# Patient Record
Sex: Male | Born: 1985 | Race: White | Hispanic: No | Marital: Married | Smoking: Never smoker
Health system: Southern US, Community
[De-identification: ages and names within clinical notes are randomized; demographics above are authoritative.]

## PROBLEM LIST (undated history)

## (undated) DIAGNOSIS — F411 Generalized anxiety disorder: Secondary | ICD-10-CM

## (undated) DIAGNOSIS — Z973 Presence of spectacles and contact lenses: Secondary | ICD-10-CM

## (undated) DIAGNOSIS — F32A Depression, unspecified: Secondary | ICD-10-CM

## (undated) DIAGNOSIS — F909 Attention-deficit hyperactivity disorder, unspecified type: Secondary | ICD-10-CM

## (undated) DIAGNOSIS — F329 Major depressive disorder, single episode, unspecified: Secondary | ICD-10-CM

## (undated) DIAGNOSIS — A630 Anogenital (venereal) warts: Secondary | ICD-10-CM

## (undated) DIAGNOSIS — K6289 Other specified diseases of anus and rectum: Secondary | ICD-10-CM

## (undated) HISTORY — DX: Attention-deficit hyperactivity disorder, unspecified type: F90.9

## (undated) HISTORY — DX: Generalized anxiety disorder: F41.1

---

## 2015-01-12 HISTORY — PX: SIGMOIDOSCOPY: SHX6686

## 2015-12-21 ENCOUNTER — Other Ambulatory Visit: Payer: Self-pay | Admitting: Surgery

## 2015-12-21 DIAGNOSIS — K6289 Other specified diseases of anus and rectum: Secondary | ICD-10-CM

## 2015-12-27 ENCOUNTER — Ambulatory Visit
Admission: RE | Admit: 2015-12-27 | Discharge: 2015-12-27 | Disposition: A | Discharge status: Home / Self Care | Payer: 59 | Source: Ambulatory Visit | Attending: Surgery | Admitting: Surgery

## 2015-12-27 DIAGNOSIS — K6289 Other specified diseases of anus and rectum: Secondary | ICD-10-CM

## 2015-12-27 MED ORDER — IOPAMIDOL (ISOVUE-300) INJECTION 61%
100.0000 mL | Freq: Once | INTRAVENOUS | Status: AC | PRN
  Administered 2015-12-27: 100 mL via INTRAVENOUS

## 2016-01-08 ENCOUNTER — Other Ambulatory Visit: Payer: Self-pay | Admitting: General Surgery

## 2016-01-08 NOTE — H&P (Signed)
History of Present Illness Lucas Herrera(Ahmaad Neidhardt MD; 01/08/2016 10:55 AM) The patient is a 30 year old male who presents with a pilonidal cyst. Lucas Herrera is a 30 year old patient who underwent incision and drainage of a pilonidal abscess by me here in the office on February 27. He states that since that time the abscess continued to drain occasionally. He was seen in the office by the urgent care doctor and treated. He continues to have occasional drainage.   Problem List/Past Medical Lucas Herrera(Katrina Brosh, MD; 01/08/2016 10:56 AM) Macario CarlsPILONIDAL ABSCESS (L05.01)  Other Problems Lucas Herrera(Harvard Zeiss, MD; 01/08/2016 10:56 AM) No pertinent past medical history  Past Surgical History Lucas Herrera(Lira Stephen, MD; 01/08/2016 10:56 AM) Oral Surgery  Diagnostic Studies History Lucas Herrera(Sajad Glander, MD; 01/08/2016 10:56 AM) Colonoscopy never  Allergies Timmothy Euler(Sade Bradford, CMA; 01/08/2016 10:39 AM) No Known Allergies 04/10/2015  Medication History Timmothy Euler(Sade Bradford, CMA; 01/08/2016 10:40 AM) No Current Medications Medications Reconciled  Social History Lucas Herrera(Zadin Lange, MD; 01/08/2016 10:56 AM) Caffeine use Coffee, Tea. Alcohol use Occasional alcohol use. No drug use Tobacco use Never smoker.  Family History Lucas Herrera(Kemper Heupel, MD; 01/08/2016 10:56 AM) No pertinent family history First Degree Relatives     Review of Systems Lucas Herrera(Lior Cartelli MD; 01/08/2016 10:56 AM) General Not Present- Appetite Loss, Chills, Fatigue, Fever, Night Sweats, Weight Gain and Weight Loss. Skin Not Present- Change in Wart/Mole, Dryness, Hives, Jaundice, New Lesions, Non-Healing Wounds, Rash and Ulcer. HEENT Not Present- Earache, Hearing Loss, Hoarseness, Nose Bleed, Oral Ulcers, Ringing in the Ears, Seasonal Allergies, Sinus Pain, Sore Throat, Visual Disturbances, Wears glasses/contact lenses and Yellow Eyes. Respiratory Not Present- Bloody sputum, Chronic Cough, Difficulty Breathing, Snoring and Wheezing. Breast Not Present- Breast  Mass, Breast Pain, Nipple Discharge and Skin Changes. Cardiovascular Not Present- Chest Pain, Difficulty Breathing Lying Down, Leg Cramps, Palpitations, Rapid Heart Rate, Shortness of Breath and Swelling of Extremities. Gastrointestinal Not Present- Abdominal Pain, Bloating, Bloody Stool, Change in Bowel Habits, Chronic diarrhea, Constipation, Difficulty Swallowing, Excessive gas, Gets full quickly at meals, Hemorrhoids, Indigestion, Nausea, Rectal Pain and Vomiting. Male Genitourinary Not Present- Blood in Urine, Change in Urinary Stream, Frequency, Impotence, Nocturia, Painful Urination, Urgency and Urine Leakage. Musculoskeletal Not Present- Back Pain, Joint Pain, Joint Stiffness, Muscle Pain, Muscle Weakness and Swelling of Extremities. Neurological Not Present- Decreased Memory, Fainting, Headaches, Numbness, Seizures, Tingling, Tremor, Trouble walking and Weakness. Psychiatric Not Present- Anxiety, Bipolar, Change in Sleep Pattern, Depression, Fearful and Frequent crying. Endocrine Not Present- Cold Intolerance, Excessive Hunger, Hair Changes, Heat Intolerance and New Diabetes. Hematology Not Present- Easy Bruising, Excessive bleeding, Gland problems, HIV and Persistent Infections.  Vitals Barron Alvine(Sade Bradford CMA; 01/08/2016 10:40 AM) 01/08/2016 10:40 AM Weight: 220.4 lb Height: 75in Body Surface Area: 2.29 m Body Mass Index: 27.55 kg/m  Temp.: 58F  Pulse: 67 (Regular)  BP: 120/80 (Sitting, Left Arm, Standard)      Physical Exam Lucas Herrera(Keiran Sias MD; 01/08/2016 10:57 AM)  General Mental Status-Alert. General Appearance-Cooperative, Not in acute distress. Orientation-Oriented X4.  Integumentary General Characteristics Overall examination of the patient's skin reveals - no rashes. Color - normal coloration of skin. Skin Moisture - normal skin moisture.  Head and Neck Head-normocephalic, atraumatic with no lesions or palpable masses.  Chest and Lung Exam Chest  and lung exam reveals -on auscultation, normal breath sounds, no adventitious sounds and normal vocal resonance.  Cardiovascular Cardiovascular examination reveals -normal heart sounds, regular rate and rhythm with no murmurs.  Abdomen Palpation/Percussion Palpation and Percussion of the abdomen reveal - Soft and Non Tender.  Rectal Note: Scar palpated left of midline. No obvious pilonidal pit noted. No tenderness. No erythema.  Neurologic Neurologic evaluation reveals -normal attention span and ability to concentrate and able to name objects and repeat phrases. Appropriate fund of knowledge .  Musculoskeletal Global Assessment Gait and Station - normal gait and station and normal posture.    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 I have recommended excision and closure of cyst cavity. We have discussed this in detail. He would like to have this done around the Christmas holidays as he will be off work during this time. Risks include bleeding, infection and recurrence.

## 2016-02-02 ENCOUNTER — Encounter (HOSPITAL_BASED_OUTPATIENT_CLINIC_OR_DEPARTMENT_OTHER): Payer: Self-pay | Admitting: *Deleted

## 2016-02-08 ENCOUNTER — Encounter (HOSPITAL_BASED_OUTPATIENT_CLINIC_OR_DEPARTMENT_OTHER): Payer: Self-pay | Admitting: *Deleted

## 2016-02-08 NOTE — Progress Notes (Signed)
NPO AFTER MN.  ARRIVE AT 0600.  NEEDS HG.  WILL TAKE AM  MEDS W/ SIPS OF WATER .   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  STATES HE WILL CALL BACK TODAY W/ PERSON/S NAME.

## 2016-02-09 ENCOUNTER — Ambulatory Visit (HOSPITAL_BASED_OUTPATIENT_CLINIC_OR_DEPARTMENT_OTHER): Payer: 59 | Admitting: Anesthesiology

## 2016-02-09 ENCOUNTER — Encounter (HOSPITAL_BASED_OUTPATIENT_CLINIC_OR_DEPARTMENT_OTHER): Payer: Self-pay

## 2016-02-09 ENCOUNTER — Ambulatory Visit (HOSPITAL_BASED_OUTPATIENT_CLINIC_OR_DEPARTMENT_OTHER)
Admission: RE | Admit: 2016-02-09 | Discharge: 2016-02-09 | Disposition: A | Discharge status: Home / Self Care | Payer: 59 | Source: Ambulatory Visit | Attending: General Surgery | Admitting: General Surgery

## 2016-02-09 ENCOUNTER — Encounter (HOSPITAL_BASED_OUTPATIENT_CLINIC_OR_DEPARTMENT_OTHER)
Admission: RE | Disposition: A | Discharge status: Home / Self Care | Payer: Self-pay | Source: Ambulatory Visit | Attending: General Surgery

## 2016-02-09 DIAGNOSIS — K6289 Other specified diseases of anus and rectum: Secondary | ICD-10-CM | POA: Diagnosis present

## 2016-02-09 DIAGNOSIS — K602 Anal fissure, unspecified: Secondary | ICD-10-CM | POA: Insufficient documentation

## 2016-02-09 DIAGNOSIS — K620 Anal polyp: Secondary | ICD-10-CM | POA: Diagnosis not present

## 2016-02-09 DIAGNOSIS — K641 Second degree hemorrhoids: Secondary | ICD-10-CM | POA: Diagnosis not present

## 2016-02-09 DIAGNOSIS — F329 Major depressive disorder, single episode, unspecified: Secondary | ICD-10-CM | POA: Diagnosis not present

## 2016-02-09 DIAGNOSIS — K644 Residual hemorrhoidal skin tags: Secondary | ICD-10-CM | POA: Diagnosis not present

## 2016-02-09 HISTORY — DX: Depression, unspecified: F32.A

## 2016-02-09 HISTORY — DX: Other specified diseases of anus and rectum: K62.89

## 2016-02-09 HISTORY — DX: Presence of spectacles and contact lenses: Z97.3

## 2016-02-09 HISTORY — PX: EVALUATION UNDER ANESTHESIA WITH FISTULECTOMY: SHX5623

## 2016-02-09 HISTORY — PX: SPHINCTEROTOMY: SHX5279

## 2016-02-09 HISTORY — DX: Major depressive disorder, single episode, unspecified: F32.9

## 2016-02-09 LAB — POCT HEMOGLOBIN-HEMACUE: Hemoglobin: 16.3 g/dL (ref 13.0–17.0)

## 2016-02-09 SURGERY — EXAM UNDER ANESTHESIA WITH FISTULECTOMY
Anesthesia: Monitor Anesthesia Care | Site: Rectum

## 2016-02-09 MED ORDER — PROPOFOL 500 MG/50ML IV EMUL
INTRAVENOUS | Status: AC
  Filled 2016-02-09: qty 50

## 2016-02-09 MED ORDER — BUPIVACAINE-EPINEPHRINE 0.5% -1:200000 IJ SOLN
INTRAMUSCULAR | Status: DC | PRN
  Administered 2016-02-09: 10 mL
  Administered 2016-02-09: 20 mL

## 2016-02-09 MED ORDER — OXYCODONE HCL 5 MG/5ML PO SOLN
5.0000 mg | Freq: Once | ORAL | Status: DC | PRN
  Filled 2016-02-09: qty 5

## 2016-02-09 MED ORDER — OXYCODONE HCL 5 MG PO TABS
5.0000 mg | ORAL_TABLET | Freq: Once | ORAL | Status: DC | PRN
  Filled 2016-02-09: qty 1

## 2016-02-09 MED ORDER — PROMETHAZINE HCL 25 MG/ML IJ SOLN
6.2500 mg | INTRAMUSCULAR | Status: DC | PRN
  Filled 2016-02-09: qty 1

## 2016-02-09 MED ORDER — LIDOCAINE 5 % EX OINT
TOPICAL_OINTMENT | CUTANEOUS | Status: DC | PRN
  Administered 2016-02-09: 1

## 2016-02-09 MED ORDER — FENTANYL CITRATE (PF) 100 MCG/2ML IJ SOLN
INTRAMUSCULAR | Status: DC | PRN
  Administered 2016-02-09: 25 ug via INTRAVENOUS

## 2016-02-09 MED ORDER — ONABOTULINUMTOXINA 100 UNITS IJ SOLR
INTRAMUSCULAR | Status: AC
  Filled 2016-02-09: qty 100

## 2016-02-09 MED ORDER — MIDAZOLAM HCL 5 MG/5ML IJ SOLN
INTRAMUSCULAR | Status: DC | PRN
  Administered 2016-02-09 (×2): 1 mg via INTRAVENOUS

## 2016-02-09 MED ORDER — LIDOCAINE HCL (CARDIAC) 20 MG/ML IV SOLN
INTRAVENOUS | Status: DC | PRN
  Administered 2016-02-09: 50 mg via INTRAVENOUS

## 2016-02-09 MED ORDER — PROPOFOL 500 MG/50ML IV EMUL
INTRAVENOUS | Status: DC | PRN
  Administered 2016-02-09: 100 ug/kg/min via INTRAVENOUS

## 2016-02-09 MED ORDER — FENTANYL CITRATE (PF) 100 MCG/2ML IJ SOLN
25.0000 ug | INTRAMUSCULAR | Status: DC | PRN
  Filled 2016-02-09: qty 1

## 2016-02-09 MED ORDER — LACTATED RINGERS IV SOLN
INTRAVENOUS | Status: DC
  Administered 2016-02-09: 06:00:00 via INTRAVENOUS
  Filled 2016-02-09: qty 1000

## 2016-02-09 MED ORDER — PROPOFOL 10 MG/ML IV BOLUS
INTRAVENOUS | Status: AC
  Filled 2016-02-09: qty 20

## 2016-02-09 MED ORDER — OXYCODONE HCL 5 MG PO TABS: 5.0000 mg | ORAL_TABLET | ORAL | 0 refills | Status: DC | PRN

## 2016-02-09 MED ORDER — FENTANYL CITRATE (PF) 100 MCG/2ML IJ SOLN
INTRAMUSCULAR | Status: AC
  Filled 2016-02-09: qty 2

## 2016-02-09 MED ORDER — ONABOTULINUMTOXINA 100 UNITS IJ SOLR
INTRAMUSCULAR | Status: DC | PRN
  Administered 2016-02-09: 50 [IU] via INTRAMUSCULAR

## 2016-02-09 MED ORDER — SODIUM CHLORIDE 0.9 % IJ SOLN
INTRAMUSCULAR | Status: AC
  Filled 2016-02-09: qty 50

## 2016-02-09 MED ORDER — EPINEPHRINE PF 1 MG/ML IJ SOLN
INTRAMUSCULAR | Status: AC
  Filled 2016-02-09: qty 1

## 2016-02-09 MED ORDER — LIDOCAINE 2% (20 MG/ML) 5 ML SYRINGE
INTRAMUSCULAR | Status: AC
  Filled 2016-02-09: qty 5

## 2016-02-09 MED ORDER — BUPIVACAINE HCL (PF) 0.5 % IJ SOLN
INTRAMUSCULAR | Status: AC
  Filled 2016-02-09: qty 30

## 2016-02-09 MED ORDER — KETOROLAC TROMETHAMINE 30 MG/ML IJ SOLN
30.0000 mg | Freq: Once | INTRAMUSCULAR | Status: DC | PRN
  Filled 2016-02-09: qty 1

## 2016-02-09 MED ORDER — ONDANSETRON HCL 4 MG/2ML IJ SOLN
INTRAMUSCULAR | Status: AC
Start: 2016-02-09 — End: 2016-02-09
  Filled 2016-02-09: qty 2

## 2016-02-09 MED ORDER — MIDAZOLAM HCL 2 MG/2ML IJ SOLN
INTRAMUSCULAR | Status: AC
  Filled 2016-02-09: qty 2

## 2016-02-09 MED ORDER — LIDOCAINE 5 % EX OINT
TOPICAL_OINTMENT | CUTANEOUS | Status: AC
  Filled 2016-02-09: qty 35.44

## 2016-02-09 MED ORDER — ONDANSETRON HCL 4 MG/2ML IJ SOLN
INTRAMUSCULAR | Status: DC | PRN
  Administered 2016-02-09: 4 mg via INTRAVENOUS

## 2016-02-09 SURGICAL SUPPLY — 37 items
BENZOIN TINCTURE PRP APPL 2/3 (GAUZE/BANDAGES/DRESSINGS) ×4 IMPLANT
BLADE HEX COATED 2.75 (ELECTRODE) ×4 IMPLANT
BLADE SURG 15 STRL LF DISP TIS (BLADE) ×2 IMPLANT
BLADE SURG 15 STRL SS (BLADE) ×2
BRIEF STRETCH FOR OB PAD LRG (UNDERPADS AND DIAPERS) ×8 IMPLANT
CANISTER SUCTION 2500CC (MISCELLANEOUS) ×4 IMPLANT
COVER BACK TABLE 60X90IN (DRAPES) ×4 IMPLANT
COVER MAYO STAND STRL (DRAPES) ×4 IMPLANT
DRAPE LAPAROTOMY 100X72 PEDS (DRAPES) ×4 IMPLANT
DRAPE UTILITY XL STRL (DRAPES) ×4 IMPLANT
DRSG PAD ABDOMINAL 8X10 ST (GAUZE/BANDAGES/DRESSINGS) ×4 IMPLANT
ELECT REM PT RETURN 9FT ADLT (ELECTROSURGICAL) ×4
ELECTRODE REM PT RTRN 9FT ADLT (ELECTROSURGICAL) ×2 IMPLANT
GAUZE SPONGE 4X4 16PLY XRAY LF (GAUZE/BANDAGES/DRESSINGS) ×4 IMPLANT
GLOVE BIO SURGEON STRL SZ 6.5 (GLOVE) ×6 IMPLANT
GLOVE BIO SURGEONS STRL SZ 6.5 (GLOVE) ×2
GLOVE INDICATOR 7.0 STRL GRN (GLOVE) ×8 IMPLANT
GOWN SPEC L3 XXLG W/TWL (GOWN DISPOSABLE) ×4 IMPLANT
GOWN STRL REUS W/ TWL LRG LVL3 (GOWN DISPOSABLE) ×2 IMPLANT
GOWN STRL REUS W/TWL LRG LVL3 (GOWN DISPOSABLE) ×2
KIT ROOM TURNOVER WOR (KITS) ×4 IMPLANT
NDL SAFETY ECLIPSE 18X1.5 (NEEDLE) ×2 IMPLANT
NEEDLE HYPO 18GX1.5 SHARP (NEEDLE) ×2
NEEDLE HYPO 22GX1.5 SAFETY (NEEDLE) ×8 IMPLANT
NS IRRIG 500ML POUR BTL (IV SOLUTION) ×4 IMPLANT
PACK BASIN DAY SURGERY FS (CUSTOM PROCEDURE TRAY) ×4 IMPLANT
PAD ABD 8X10 STRL (GAUZE/BANDAGES/DRESSINGS) ×4 IMPLANT
PENCIL BUTTON HOLSTER BLD 10FT (ELECTRODE) ×4 IMPLANT
SPONGE GAUZE 4X4 12PLY STER LF (GAUZE/BANDAGES/DRESSINGS) ×4 IMPLANT
SUT CHROMIC 3 0 SH 27 (SUTURE) ×4 IMPLANT
SYR CONTROL 10ML LL (SYRINGE) ×4 IMPLANT
TOWEL OR 17X24 6PK STRL BLUE (TOWEL DISPOSABLE) ×4 IMPLANT
TRAY DSU PREP LF (CUSTOM PROCEDURE TRAY) ×4 IMPLANT
TUBE CONNECTING 12'X1/4 (SUCTIONS) ×1
TUBE CONNECTING 12X1/4 (SUCTIONS) ×3 IMPLANT
UNDERPAD 30X30 INCONTINENT (UNDERPADS AND DIAPERS) ×4 IMPLANT
YANKAUER SUCT BULB TIP NO VENT (SUCTIONS) ×4 IMPLANT

## 2016-02-09 NOTE — Anesthesia Preprocedure Evaluation (Signed)
Anesthesia Evaluation  Patient identified by MRN, date of birth, ID band Patient awake    Reviewed: Allergy & Precautions, NPO status , Patient's Chart, lab work & pertinent test results  Airway Mallampati: II  TM Distance: >3 FB Neck ROM: Full    Dental no notable dental hx.    Pulmonary neg pulmonary ROS,    Pulmonary exam normal breath sounds clear to auscultation       Cardiovascular negative cardio ROS Normal cardiovascular exam Rhythm:Regular Rate:Normal     Neuro/Psych negative neurological ROS  negative psych ROS   GI/Hepatic negative GI ROS, Neg liver ROS,   Endo/Other  negative endocrine ROS  Renal/GU negative Renal ROS  negative genitourinary   Musculoskeletal negative musculoskeletal ROS (+)   Abdominal   Peds negative pediatric ROS (+)  Hematology negative hematology ROS (+)   Anesthesia Other Findings   Reproductive/Obstetrics negative OB ROS                             Anesthesia Physical Anesthesia Plan  ASA: I  Anesthesia Plan: MAC   Post-op Pain Management:    Induction: Intravenous  Airway Management Planned: Simple Face Mask  Additional Equipment:   Intra-op Plan:   Post-operative Plan:   Informed Consent: I have reviewed the patients History and Physical, chart, labs and discussed the procedure including the risks, benefits and alternatives for the proposed anesthesia with the patient or authorized representative who has indicated his/her understanding and acceptance.   Dental advisory given  Plan Discussed with: CRNA and Surgeon  Anesthesia Plan Comments:         Anesthesia Quick Evaluation  

## 2016-02-09 NOTE — Anesthesia Procedure Notes (Signed)
Procedure Name: MAC Date/Time: 02/09/2016 7:36 AM Performed by: Eilene GhaziOSE, GEORGE Pre-anesthesia Checklist: Patient identified, Emergency Drugs available, Suction available, Patient being monitored and Timeout performed Oxygen Delivery Method: Simple face mask Placement Confirmation: positive ETCO2 and breath sounds checked- equal and bilateral

## 2016-02-09 NOTE — Transfer of Care (Signed)
  Last Vitals:  Vitals:   02/09/16 0608  BP: 120/72  Pulse: 94  Resp: 16  Temp: 36.9 C    Last Pain:  Vitals:   02/09/16 [id number]  TempSrc: Oral      Patients Stated Pain Goal: 7 (02/09/16 0617)  Immediate Anesthesia Transfer of Care Note  Patient: Lucas Herrera  Procedure(s) Performed: Procedure(s) (LRB): EXAM UNDER ANESTHESIA with EXCISIONAL BIOPSY OF ANAL CANAL MASS (N/A) CHEMICAL SPHINCTEROTOMY (N/A)  Patient Location: PACU  Anesthesia Type:MAC  Level of Consciousness: awake, alert  and oriented  Airway & Oxygen Therapy: Patient Spontanous Breathing and Patient connected to face mask oxygen  Post-op Assessment: Report given to PACU RN and Post -op Vital signs reviewed and stable  Post vital signs: Reviewed and stable  Complications: No apparent anesthesia complications

## 2016-02-09 NOTE — Op Note (Signed)
02/09/2016  9:19 AM  PATIENT:  Lucas Herrera  30 y.o. male  Patient Care Team: No Pcp Per Patient as PCP - General (General Practice)  PRE-OPERATIVE DIAGNOSIS:  ANAL PAIN, ANAL MASS  POST-OPERATIVE DIAGNOSIS:  ANAL FISSURE, ANAL MASS  PROCEDURE:  EXAM UNDER ANESTHESIA with EXCISIONAL BIOPSY OF ANAL CANAL MASS CHEMICAL SPHINCTEROTOMY   Surgeon(s): Romie LeveeAlicia Damisha Wolff, MD  ASSISTANT: none   ANESTHESIA:   local and MAC  SPECIMEN:  Source of Specimen:  posterior anal canal mass  DISPOSITION OF SPECIMEN:  PATHOLOGY  COUNTS:  YES  PLAN OF CARE: Discharge to home after PACU  PATIENT DISPOSITION:  PACU - hemodynamically stable.  INDICATION: 30 year old male with chronic anal pain for approximately one year.  He is settled all attempts at medical management and is here for evaluation and treatment.   OR FINDINGS: Posterior midline anal fissure with associated anal skin tag  DESCRIPTION: the patient was identified in the preoperative holding area and taken to the OR where they were laid on the operating room table.  MAC anesthesia was induced without difficulty. The patient was then positioned in prone jackknife position with buttocks gently taped apart.  The patient was then prepped and draped in usual sterile fashion.  SCDs were noted to be in place prior to the initiation of anesthesia. A surgical timeout was performed indicating the correct patient, procedure, positioning and need for preoperative antibiotics.  A rectal block was performed using Marcaine with epinephrine.    I began with a digital rectal exam.  The mass could be palpated in the right posterior region.  I then placed a Hill-Ferguson anoscope into the anal canal and evaluated this completely.  There was no hemorrhoid disease. There was no sign of fistula. A posterior midline anal fissure with what appeared to be an associated elongated anal skin tag. Given that the skin tag would most likely cause him trouble in the  future due to its large nature, I removed this with scissors. The site was then closed using a 3-0 chromic suture. I then used 50 units of Botox to create a chemical sphincterotomy by injecting this into the intersphincteric groove. The patient tolerated this well and was sent to the postanesthesia care unit in stable condition. All counts are correct per operating room staff.

## 2016-02-09 NOTE — Anesthesia Postprocedure Evaluation (Signed)
Anesthesia Post Note  Patient: Lucas Herrera  Procedure(s) Performed: Procedure(s) (LRB): EXAM UNDER ANESTHESIA with EXCISIONAL BIOPSY OF ANAL CANAL MASS (N/A) CHEMICAL SPHINCTEROTOMY (N/A)  Patient location during evaluation: PACU Anesthesia Type: MAC Level of consciousness: awake and alert Pain management: pain level controlled Vital Signs Assessment: post-procedure vital signs reviewed and stable Respiratory status: spontaneous breathing, nonlabored ventilation, respiratory function stable and patient connected to nasal cannula oxygen Cardiovascular status: stable and blood pressure returned to baseline Anesthetic complications: no       Last Vitals:  Vitals:   02/09/16 0815 02/09/16 0830  BP: 125/64 123/62  Pulse: 88 73  Resp: 15 13  Temp:      Last Pain:  Vitals:   02/09/16 0830  TempSrc:   PainSc: 0-No pain                 Zubin Pontillo S

## 2016-02-09 NOTE — Discharge Instructions (Addendum)
Post Anesthesia Home Care Instructions  Activity: Get plenty of rest for the remainder of the day. A responsible adult should stay with you for 24 hours following the procedure.  For the next 24 hours, DO NOT: -Drive a car -Operate machinery -Drink alcoholic beverages -Take any medication unless instructed by your physician -Make any legal decisions or sign important papers.  Meals: Start with liquid foods such as gelatin or soup. Progress to regular foods as tolerated. Avoid greasy, spicy, heavy foods. If nausea and/or vomiting occur, drink only clear liquids until the nausea and/or vomiting subsides. Call your physician if vomiting continues.  Special Instructions/Symptoms: Your throat may feel dry or sore from the anesthesia or the breathing tube placed in your throat during surgery. If this causes discomfort, gargle with warm salt water. The discomfort should disappear within 24 hours.  If you had a scopolamine patch placed behind your ear for the management of post- operative nausea and/or vomiting:  1. The medication in the patch is effective for 72 hours, after which it should be removed.  Wrap patch in a tissue and discard in the trash. Wash hands thoroughly with soap and water. 2. You may remove the patch earlier than 72 hours if you experience unpleasant side effects which may include dry mouth, dizziness or visual disturbances. 3. Avoid touching the patch. Wash your hands with soap and water after contact with the patch.   ANORECTAL SURGERY: POST OP INSTRUCTIONS 1. Take your usually prescribed home medications unless otherwise directed. 2. DIET: During the first few hours after surgery sip on some liquids until you are able to urinate.  It is normal to not urinate for several hours after this surgery.  If you feel uncomfortable, please contact the office for instructions.  After you are able to urinate,you may eat, if you feel like it.  Follow a light bland diet the first 24  hours after arrival home, such as soup, liquids, crackers, etc.  Be sure to include lots of fluids daily (6-8 glasses).  Avoid fast food or heavy meals, as your are more likely to get nauseated.  Eat a low fat diet the next few days after surgery.  Limit caffeine intake to 1-2 servings a day. 3. PAIN CONTROL: a. Pain is best controlled by a usual combination of several different methods TOGETHER: i. Muscle relaxation: Soak in a warm bath (or Sitz bath) three times a day and after bowel movements.  Continue to do this until all pain is resolved. ii. Over the counter pain medication iii. Prescription pain medication b. Most patients will experience some swelling and discomfort in the anus/rectal area and incisions.  Heat such as warm towels, sitz baths, warm baths, etc to help relax tight/sore spots and speed recovery.  Some people prefer to use ice, especially in the first couple days after surgery, as it may decrease the pain and swelling, or alternate between ice & heat.  Experiment to what works for you.  Swelling and bruising can take several weeks to resolve.  Pain can take even longer to completely resolve. c. It is helpful to take an over-the-counter pain medication regularly for the first few weeks.  Choose one of the following that works best for you: i. Naproxen (Aleve, etc)  Two 220mg tabs twice a day ii. Ibuprofen (Advil, etc) Three 200mg tabs four times a day (every meal & bedtime) d. A  prescription for pain medication (such as percocet, oxycodone, hydrocodone, etc) should be given to you upon   upon discharge.  Take your pain medication as prescribed.  i. If you are having problems/concerns with the prescription medicine (does not control pain, nausea, vomiting, rash, itching, etc), please call us [phone] to see if we need to switch you to a different pain medicine that will work better for you and/or control your side effect better. ii. If you need a refill on your pain medication, please  contact your pharmacy.  They will contact our office to request authorization. Prescriptions will not be filled after 5 pm or on week-ends. 4. KEEP YOUR BOWELS REGULAR and AVOID CONSTIPATION a. The goal is one to two soft bowel movements a day.  You should at least have a bowel movement every other day.  You may notice some trouble controlling your bowel movements at first.  This is due to the treatment for your fissure and will improve with time.   b. Avoid getting constipated.  Between the surgery and the pain medications, it is common to experience some constipation. This can be very painful after rectal surgery.  Increasing fluid intake and taking a fiber supplement (such as Metamucil, Citrucel, FiberCon, etc) 1-2 times a day regularly will usually help prevent this problem from occurring.  A stool softener like colace is also recommended.  This can be purchased over the counter at your pharmacy.  You can take it up to 3 times a day.  If you do not have a bowel movement after 24 hrs since your surgery, take one does of milk of magnesia.  If you still haven't had a bowel movement 8-12 hours after that dose, take another dose.  If you don't have a bowel movement 48 hrs after surgery, purchase a Fleets enema from the drug store and administer gently per package instructions.  If you still are having trouble with your bowel movements after that, please call the office for further instructions. c. If you develop diarrhea or have many loose bowel movements, simplify your diet to bland foods & liquids for a few days.  Stop any stool softeners and decrease your fiber supplement.  Switching to mild anti-diarrheal medications (Kayopectate, Pepto Bismol) can help.  If this worsens or does not improve, please call us.  5. Wound Care a. Remove your bandages before your first bowel movement or 8 hours after surgery.     b. Remove any wound packing material at this tim,e as well.  You do not need to repack the wound  unless instructed otherwise.  Wear an absorbent pad or soft cotton gauze in your underwear to catch any drainage and help keep the area clean. You should change this every 2-3 hours while awake. c. Keep the area clean and dry.  Bathe / shower every day, especially after bowel movements.  Keep the area clean by showering / bathing over the incision / wound.   It is okay to soak an open wound to help wash it.  Wet wipes or showers / gentle washing after bowel movements is often less traumatic than regular toilet paper. d. Bonita QuinYou may have some styrofoam-like soft packing in the rectum which will come out with the first bowel movement.  e. You will often notice bleeding with bowel movements.  This should slow down by the end of the first week of surgery f. Expect some drainage.  This should slow down, too, by the end of the first week of surgery.  Wear an absorbent pad or soft cotton gauze in your underwear until the drainage  stops. g. Do Not sit on a rubber or pillow ring.  This can make you symptoms worse.  You may sit on a soft pillow if needed.  6. ACTIVITIES as tolerated:   a. You may resume regular (light) daily activities beginning the next day--such as daily self-care, walking, climbing stairs--gradually increasing activities as tolerated.  If you can walk 30 minutes without difficulty, it is safe to try more intense activity such as jogging, treadmill, bicycling, low-impact aerobics, swimming, etc. b. Save the most intensive and strenuous activity for last such as sit-ups, heavy lifting, contact sports, etc  Refrain from any heavy lifting or straining until you are off narcotics for pain control.   c. You may drive when you are no longer taking prescription pain medication, you can comfortably sit for long periods of time, and you can safely maneuver your car and apply brakes. d. Bonita QuinYou may have sexual intercourse when it is comfortable.  7. FOLLOW UP in our office a. Please call [facility] at [phone] to  set up an appointment to see your surgeon in the office for a follow-up appointment approximately 3-4 weeks after your surgery. b. Make sure that you call for this appointment the day you arrive home to insure a convenient appointment time. 10. IF YOU HAVE DISABILITY OR FAMILY LEAVE FORMS, BRING THEM TO THE OFFICE FOR PROCESSING.  DO NOT GIVE THEM TO YOUR DOCTOR.     WHEN TO CALL US [phone]) [phone]: 1. Poor pain control 2. Reactions / problems with new medications (rash/itching, nausea, etc)  3. Fever over 101.5 F (38.5 C) 4. Inability to urinate 5. Nausea and/or vomiting 6. Worsening swelling or bruising 7. Continued bleeding from incision. 8. Increased pain, redness, or drainage from the incision  The clinic staff is available to answer your questions during regular business hours (8:30am-5pm).  Please dont hesitate to call and ask to speak to one of our nurses for clinical concerns.   A surgeon from [facility] is always on call at the hospitals   If you have a medical emergency, go to the nearest emergency room or call 911.    [facility] [address], [address], [place], [place]  [address] ? MAIN: [phone]) [phone] ? TOLL FREE: [phone] ? FAX [phone] [email or web address]

## 2016-02-09 NOTE — H&P (Addendum)
Patient is self-referred for evaluation of anorectal pain and bleeding with bowel movements. Patient states that he has had pain after bowel movements for approximately 1 year. He did undergo a flexible sigmoidoscopy and hemorrhoid banding procedure in December 2016 [facility]. This provided some symptomatic relief. Patient experiences mild bleeding with each bowel movement. He experiences onset of pain with each bowel movement which may last up to 8 hours. He has tried topical creams without symptomatic improvement. Patient denies any significant drainage. He has noted a firm masslike area in the left anterior anal region, which enlarges and regresses with time.   Problem List/Past Medical Romie Levee(Avira Tillison, MD; 01/08/2016 11:43 AM) GRADE II INTERNAL HEMORRHOIDS (K64.1)  ANAL OR RECTAL PAIN (K62.89)   Other Problems Romie Levee(Rian Busche, MD; 01/08/2016 11:43 AM) Hemorrhoids  Depression   Diagnostic Studies History Romie Levee(Rei Medlen, MD; 01/08/2016 11:43 AM) Colonoscopy  never  Allergies Barron Alvine(Sade [place], CMA; 01/08/2016 11:29 AM) No Known Drug Allergies 12/19/2015  Medication History Romie Levee(Bernadene Garside, MD; 01/08/2016 11:43 AM) BuPROPion HCl (75MG  Tablet, Oral daily) Active. Viibryd (40MG  Tablet, Oral daily) Active. Medications Reconciled Analpram HC (2.5-1% Cream, 1 (one) Application Rectal three times daily, as needed, Taken starting 12/19/2015) Active. (External use only. Throw away applicator please.)  Social History Romie Levee(Fredrich Cory, MD; 01/08/2016 11:43 AM) Illicit drug use  Prefer to discuss with provider. Caffeine use  Carbonated beverages, Coffee. Tobacco use  Never smoker. Alcohol use  Moderate alcohol use.  Family History Romie Levee(Zamar Odwyer, MD; 01/08/2016 11:43 AM) Thyroid problems  Mother.  BP 120/72   Pulse 94   Temp 98.4 F (36.9 C) (Oral)   Resp 16   Ht 6\' 3"  (1.905 m)   Wt 108 kg (238 lb)   SpO2 98%   BMI 29.75 kg/m    Physical Exam  The  physical exam findings are as follows: Note:General - appears comfortable, no distress; not diaphorectic  HEENT - normocephalic; sclerae clear, gaze conjugate; mucous membranes moist, dentition good; voice normal  Neck - symmetric on extension; no palpable anterior or posterior cervical adenopathy; no palpable masses in the thyroid bed  Chest - clear bilaterally without rhonchi, rales, or wheeze  Cor - regular rhythm with normal rate; no significant murmur  Abd - soft without distension; no tenderness  Rectal - external examination shows no skin tags. There is no sign of fistula. On palpation there is an area of firm induration with significant tenderness in the posteior midline. There is no fluctuance. There is no drainage. With eversion of the anus there is no sign of fissure. Digital rectal exam shows moderate tenderness throughout.  Anoscopy is performed. Lower rectal mucosa was grossly normal. No direct pathology could be visualized due to pain.  Ext - non-tender without significant edema or lymphedema  Neuro - grossly intact; no tremor    Assessment & Plan  ANAL OR RECTAL PAIN (K62.89) Impression: 30 year old male with chronic anal pain for approximately one year. On physical exam there is a mass palpated in the posterior midline. A previous mass has been able to be palpated in the L anterior region. I think this may be due to chronic inflammation but I need to examine further to rule out a cancer. I think possibly he could have a fistula or a fissure. Less likely this could be a tumor. I have recommended an exam under anesthesia with possible biopsy and fistulotomy or seton if a abscess or fistula is found. If this appears to be a fissure, I  have recommended chemical sphincterotomy, given the chronicity of his symptoms.

## 2016-02-13 ENCOUNTER — Encounter (HOSPITAL_BASED_OUTPATIENT_CLINIC_OR_DEPARTMENT_OTHER): Payer: Self-pay | Admitting: General Surgery

## 2016-02-27 ENCOUNTER — Other Ambulatory Visit: Payer: Self-pay | Admitting: General Surgery

## 2018-02-26 ENCOUNTER — Telehealth: Payer: Self-pay | Admitting: Psychiatry

## 2018-02-26 ENCOUNTER — Ambulatory Visit (INDEPENDENT_AMBULATORY_CARE_PROVIDER_SITE_OTHER): Payer: BLUE CROSS/BLUE SHIELD | Admitting: Psychiatry

## 2018-02-26 DIAGNOSIS — F329 Major depressive disorder, single episode, unspecified: Secondary | ICD-10-CM | POA: Diagnosis not present

## 2018-02-26 DIAGNOSIS — F902 Attention-deficit hyperactivity disorder, combined type: Secondary | ICD-10-CM

## 2018-02-26 DIAGNOSIS — F32A Depression, unspecified: Secondary | ICD-10-CM

## 2018-02-26 MED ORDER — AMPHETAMINE-DEXTROAMPHETAMINE 30 MG PO TABS: ORAL_TABLET | ORAL | 0 refills | Status: DC

## 2018-02-26 MED ORDER — VILAZODONE HCL 10 MG PO TABS: ORAL_TABLET | ORAL | 0 refills | Status: DC

## 2018-02-26 NOTE — Progress Notes (Signed)
[facility] MD/PA/NP Initial Note  02/26/2018 1:04 PM Lucas Herrera  MRN:  [id number]  Chief Complaint:   HPI: Patient complains of depression, anxiety, needing treatment for ADHD. Depression-started in high school last from 4 days to a week he has been treating it on and off for 4 years.  With his depression he cries, isolates, decreased energy decreased motivation, anhedonia.  Denies suicidal ideation.  Most recent worsening of depression was last week.  He states he has depression all the time but gets worse with these 4 days to a week episode. Manic symptoms he is in a good mood talks more sleep less impulsive and goal oriented.  Last 1 to 2 days at most.  Moods are steady.  Patient filled out the mood disorder questionnaire and it was strongly positive with a moderate problem.  I had the patient do the MDQ again as if he was on a stimulant he had less positives and it was only a minor problem. Patient has anger irritability hours to a day.  Decreased sleep decreased help. Anxiety at night and social anxiety.  He states his mind is always on. ADHD as an adult he was on Adderall 18 months.  He did the ADHD questionnaires and he is positive both as a child with symptoms and as an adult with symptoms. OCD obsessive thoughts  Visit Diagnosis: depression. ADHD  Past Psychiatric History: [facility] for 18 months. Psychiatric meds tried include Adderall, Viibryd, Trintellix, Paxil, Wellbutrin, Zoloft. Past Medical History: negative Past Medical History:  Diagnosis Date  . Anal pain   . Depression   . Rectal mass   . Wears glasses     Past Surgical History:  Procedure Laterality Date  . EVALUATION UNDER ANESTHESIA WITH FISTULECTOMY N/A 02/09/2016   Procedure: EXAM UNDER ANESTHESIA with EXCISIONAL BIOPSY OF ANAL CANAL MASS;  Surgeon: Romie LeveeAlicia Thomas, MD;  [facility];  Service: General;  Laterality: N/A;  . SIGMOIDOSCOPY  01/2015  . SPHINCTEROTOMY N/A 02/09/2016    Procedure: CHEMICAL SPHINCTEROTOMY;  Surgeon: Romie LeveeAlicia Thomas, MD;  [facility];  Service: General;  Laterality: N/A;    Family Psychiatric History: mom with depression  Family History: positive for MI and possibly for dysrhythmias  Social History: job Production designer, theatre/television/filmmanager at [employer]. He is divorced.  His ex-wife name is Advertising account plannerTabitha Machia.  He has 1 son. He drinks 8 cups of coffee a day.  He uses pot once a week for sleep.  Denies trying any other illicit substance.  Did drink a lot in college where he had blackouts but no seizures. Social History   Socioeconomic History  . Marital status: Legally Separated    Spouse name: Not on file  . Number of children: Not on file  . Years of education: Not on file  . Highest education level: Not on file  Occupational History  . Not on file  Social Needs  . Financial resource strain: Not on file  . Food insecurity:    Worry: Not on file    Inability: Not on file  . Transportation needs:    Medical: Not on file    Non-medical: Not on file  Tobacco Use  . Smoking status: Never Smoker  . Smokeless tobacco: Never Used  Substance and Sexual Activity  . Alcohol use: Yes    Alcohol/week: 6.0 standard drinks    Types: 6 Cans of beer per week  . Drug use: Yes    Types: Marijuana  . Sexual  activity: Not on file  Lifestyle  . Physical activity:    Days per week: Not on file    Minutes per session: Not on file  . Stress: Not on file  Relationships  . Social connections:    Talks on phone: Not on file    Gets together: Not on file    Attends religious service: Not on file    Active member of club or organization: Not on file    Attends meetings of clubs or organizations: Not on file    Relationship status: Not on file  Other Topics Concern  . Not on file  Social History Narrative  . Not on file    Allergies: No Known Allergies  Metabolic Disorder Labs: No results found for: HGBA1C, MPG No results found for:  PROLACTIN No results found for: CHOL, TRIG, HDL, CHOLHDL, VLDL, LDLCALC No results found for: TSH  Therapeutic Level Labs: No results found for: LITHIUM No results found for: VALPROATE No components found for:  CBMZ  Current Medications: Current Outpatient Medications  Medication Sig Dispense Refill  . buPROPion (WELLBUTRIN) 75 MG tablet Take 75 mg by mouth every morning.    Marland Kitchen oxyCODONE (OXY IR/ROXICODONE) 5 MG immediate release tablet Take 1-2 tablets (5-10 mg total) by mouth every 4 (four) hours as needed. 30 tablet 0  . Vilazodone HCl (VIIBRYD) 40 MG TABS Take 40 mg by mouth every morning.     No current facility-administered medications for this visit.     Medication Side Effects: none  Orders placed this visit:  No orders of the defined types were placed in this encounter.   Psychiatric Specialty Exam:  ROS  There were no vitals taken for this visit.There is no height or weight on file to calculate BMI.  General Appearance: Casual  Eye Contact:  Good  Speech:  Normal Rate  Volume:  Normal  Mood:  Euthymic  Affect:  Appropriate  Thought Process:  Linear  Orientation:  Full (Time, Place, and Person)  Thought Content: Logical   Suicidal Thoughts:  No  Homicidal Thoughts:  No  Memory:  WNL  Judgement:  Good  Insight:  Good  Psychomotor Activity:  Normal  Concentration:  Concentration: Good  Recall:  Good  Fund of Knowledge: Good  Language: Good  Assets:  Desire for Improvement  ADL's:  Intact  Cognition: WNL  Prognosis:  Good   Screenings:   Receiving Psychotherapy: No   Treatment Plan/Recommendations: Patient will continue on Viibryd 10 mg a day (one half of a 20 mg).  Also Adderall 30 mg 1-1/2 a day. We will recheck in 1 month.  Patient's review of systems shows blood pressure 144/64 pulse of 95.  Weight is 200 pounds.  Height is 6 feet 3 inches.  His review of systems is negative. Patient is to sign a release of information that we get from the [facility] for counseling and medication management.    Anne Fu, PA-C

## 2018-02-26 NOTE — Telephone Encounter (Signed)
Pt. Was seen today and was told that his prescriptions will be [facility]rib[facility]  [facility] [address] The medicines were not there. So please escribe viibryd 20 mg  30 day and adderrall 30 mg

## 2018-02-27 ENCOUNTER — Other Ambulatory Visit: Payer: Self-pay | Admitting: Psychiatry

## 2018-02-27 MED ORDER — VILAZODONE HCL 20 MG PO TABS: 20.0000 mg | ORAL_TABLET | Freq: Every day | ORAL | 0 refills | Status: DC

## 2018-02-27 MED ORDER — VILAZODONE HCL 10 MG PO TABS: ORAL_TABLET | ORAL | 0 refills | Status: DC

## 2018-02-27 MED ORDER — AMPHETAMINE-DEXTROAMPHETAMINE 30 MG PO TABS: ORAL_TABLET | ORAL | 0 refills | Status: DC

## 2018-04-02 ENCOUNTER — Ambulatory Visit: Payer: BLUE CROSS/BLUE SHIELD | Admitting: Psychiatry

## 2018-04-02 DIAGNOSIS — F329 Major depressive disorder, single episode, unspecified: Secondary | ICD-10-CM | POA: Diagnosis not present

## 2018-04-02 DIAGNOSIS — F909 Attention-deficit hyperactivity disorder, unspecified type: Secondary | ICD-10-CM | POA: Diagnosis not present

## 2018-04-02 DIAGNOSIS — F32A Depression, unspecified: Secondary | ICD-10-CM

## 2018-04-02 MED ORDER — VILAZODONE HCL 20 MG PO TABS: 20.0000 mg | ORAL_TABLET | Freq: Every day | ORAL | 2 refills | Status: DC

## 2018-04-02 MED ORDER — AMPHETAMINE-DEXTROAMPHETAMINE 30 MG PO TABS: ORAL_TABLET | ORAL | 0 refills | Status: DC

## 2018-04-02 NOTE — Progress Notes (Signed)
[facility] Med Check  Patient ID: Lucas Herrera,  MRN: [id number]  PCP: Patient, No Pcp Per  Date of Evaluation: 04/02/2018 Time spent:20 minutes  Chief Complaint:   HISTORY/CURRENT STATUS: HPI patient's initial visit was 1 month ago.  He has depression and ADHD.  He had been off his medicines.  We restarted his medicines Viibryd 20 mg one half a day and Adderall 30 mg 1-1/2 a day.  Since he is restarted his depression is better ADHD is better.  Individual Medical History/ Review of Systems: Changes? :No   Allergies: Patient has no known allergies.  Current Medications:  Current Outpatient Medications:  .  amphetamine-dextroamphetamine (ADDERALL) 30 MG tablet, 1 and 1/2 per day, Disp: 45 tablet, Rfl: 0 .  Vilazodone HCl (VIIBRYD) 10 MG TABS, 1/ day, Disp: 30 tablet, Rfl: 0 .  Vilazodone HCl (VIIBRYD) 20 MG TABS, Take 1 tablet (20 mg total) by mouth daily., Disp: 7 tablet, Rfl: 0 Medication Side Effects: none  Family Medical/ Social History: Changes? no MENTAL HEALTH EXAM:  There were no vitals taken for this visit.There is no height or weight on file to calculate BMI.  General Appearance: Casual  Eye Contact:  Good  Speech:  Normal Rate  Volume:  Normal  Mood:  Euthymic  Affect:  Appropriate  Thought Process:  Linear  Orientation:  Full (Time, Place, and Person)  Thought Content: Logical   Suicidal Thoughts:  No  Homicidal Thoughts:  No  Memory:  WNL  Judgement:  Good  Insight:  Good  Psychomotor Activity:  Normal  Concentration:  Concentration: Good  Recall:  Good  Fund of Knowledge: Good  Language: Good  Assets:  Desire for Improvement  ADL's:  Intact  Cognition: WNL  Prognosis:  Good  bp 126/73 with pulse of 62 DIAGNOSES: No diagnosis found.  Receiving Psychotherapy: No    RECOMMENDATIONS: We will continue his same medicines as Viibryd 20 mg 1/2 tablet a day and Adderall 30 mg 1/2 tablet a day He is also to occasionally check his blood pressure  and bring the readings when he comes back. Next time he comes in we will get a release of information from the [facility] for counseling and medication. He is to return in 3 months.   Anne Fu, PA-C

## 2018-06-29 ENCOUNTER — Ambulatory Visit: Payer: BLUE CROSS/BLUE SHIELD | Admitting: Psychiatry

## 2018-07-30 ENCOUNTER — Encounter: Payer: Self-pay | Admitting: Psychiatry

## 2018-07-30 ENCOUNTER — Other Ambulatory Visit: Payer: Self-pay

## 2018-07-30 ENCOUNTER — Ambulatory Visit: Payer: BLUE CROSS/BLUE SHIELD | Admitting: Psychiatry

## 2018-07-30 VITALS — Ht 75.0 in | Wt 199.0 lb

## 2018-07-30 DIAGNOSIS — F330 Major depressive disorder, recurrent, mild: Secondary | ICD-10-CM

## 2018-07-30 DIAGNOSIS — F4010 Social phobia, unspecified: Secondary | ICD-10-CM | POA: Diagnosis not present

## 2018-07-30 DIAGNOSIS — F900 Attention-deficit hyperactivity disorder, predominantly inattentive type: Secondary | ICD-10-CM

## 2018-07-30 DIAGNOSIS — F331 Major depressive disorder, recurrent, moderate: Secondary | ICD-10-CM | POA: Insufficient documentation

## 2018-07-30 DIAGNOSIS — F3341 Major depressive disorder, recurrent, in partial remission: Secondary | ICD-10-CM | POA: Insufficient documentation

## 2018-07-30 MED ORDER — AMPHETAMINE-DEXTROAMPHETAMINE 30 MG PO TABS: ORAL_TABLET | ORAL | 0 refills | Status: DC

## 2018-07-30 MED ORDER — VIIBRYD 20 MG PO TABS: 20.0000 mg | ORAL_TABLET | Freq: Every day | ORAL | 1 refills | Status: DC

## 2018-07-30 NOTE — Progress Notes (Signed)
[facility] Med Check  Patient ID: Lucas Herrera K [place],  MRN: [id number]  PCP: Patient, No Pcp Per  Date of Evaluation: 07/30/2018 Time spent:20 minutes from 0905 to 0925  Chief Complaint:  Chief Complaint    Depression; ADHD; Anxiety      HISTORY/CURRENT STATUS: Lucas Herrera is seen individually face-to-face onsite in office with consent with epic old chart collateral from previous provider Anne Fulay Shugart deceased recently for psychiatric interview and exam in 5536-month evaluation and management of depression, ADHD, and social anxiety.  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  He describes anxious distress with depressive relapses but [facility] the past where he could not establish a therapeutic relationship with the prescribing doctor.  [facility] his most provider prior to this office required him to attend therapy if to also receive medication management but insurance does not fully cover.  He had titrated Viibryd to 40 mg daily having sexual dysfunction side effects and nausea also treated with Adderall.  As he started treatment with Anne Fulay Shugart 5 months ago, he resumed the Viibryd he had stopped at one half of a 20 mg tablet and a reduced dose of Adderall 30 mg in the morning and 15 mg in the afternoon he concludes to be working well since then.  [place] registry documents last fill of Adderall 07/01/2018 pattern of appropriate use.  No mania, suicidality, psychosis, or substance use delirium.  Depression        This is a recurrent problem.  The current episode started more than 1 month ago.   The onset quality is sudden.   The problem occurs intermittently.  The problem has been gradually improving since onset.  Associated symptoms include decreased concentration, decreased interest, appetite change, indigestion and sad.  Associated symptoms include no  hopelessness, does not have insomnia, no headaches and no suicidal ideas.     The symptoms are aggravated by social issues, family issues and work stress.  Past treatments include SSRIs - Selective serotonin reuptake inhibitors, other medications and psychotherapy.  Compliance with treatment is variable.  Past compliance problems include difficulty with treatment plan and medication issues.  Previous treatment provided moderate relief.  Risk factors include a change in medication usage/dosage.    Individual Medical History/ Review of Systems: Changes? :No    Wears glasses             Date  . EVALUATION UNDER ANESTHESIA WITH FISTULECTOMY  02/09/2016   Procedure: EXAM UNDER ANESTHESIA with EXCISIONAL BIOPSY OF ANAL CANAL MASS;  Surgeon: Romie LeveeAlicia Thomas, MD;  [facility] ;  Service: General;  Laterality: N/A;  . SIGMOIDOSCOPY  01/2015  . SPHINCTEROTOMY      Psychiatric meds tried include Adderall, Viibryd, Trintellix, Paxil, Wellbutrin, Zoloft. Allergies: Patient has no known allergies.  Current Medications:  Current Outpatient Medications:  .  [START ON 07/31/2018] amphetamine-dextroamphetamine (ADDERALL) 30 MG tablet, Take one tab total 30 mg every morning and 1/2 tab total 15 mg every 3 PM, Disp: 45 tablet, Rfl: 0 .  [START ON 08/30/2018] amphetamine-dextroamphetamine (ADDERALL) 30 MG tablet, Take one tab total 30 mg every morning and 1/2 tab total 15 mg every 3 PM, Disp: 45 tablet, Rfl: 0 .  [START ON 09/29/2018] amphetamine-dextroamphetamine (ADDERALL) 30 MG tablet, Take one tab total 30 mg every morning and 1/2 tab total 15 mg every 3 PM, Disp: 45 tablet, Rfl: 0 .  Vilazodone HCl (VIIBRYD) 20 MG TABS, Take 1 tablet (20 mg total) by mouth daily after breakfast., Disp: 30 tablet, Rfl: 1   Medication Side Effects: nausea and sexual dysfunction likely from Viibryd 40 mg  Family Medical/ Social History: Changes? No, mother with depression family history of MI and  possibly for dysrhythmias  MENTAL HEALTH EXAM:  Height 6\' 3"  (1.905 m), weight 199 lb (90.3 kg).Body mass index is 24.87 kg/m. Otherwise deferred for coronavirus pandemic nonessential  General Appearance: Casual, Fairly Groomed and Guarded  Eye Contact:  Fair  Speech:  Clear and Coherent and Normal Rate  Volume:  Normal  Mood:  Anxious, Dysphoric and Euthymic  Affect:  Appropriate, Constricted, Depressed and Anxious  Thought Process:  Coherent, Irrelevant and Linear  Orientation:  Full (Time, Place, and Person)  Thought Content: Obsessions and Rumination   Suicidal Thoughts:  No  Homicidal Thoughts:  No  Memory:  Immediate;   Good Remote;   Good  Judgement:  Good  Insight:  Good  Psychomotor Activity:  Normal and Mannerisms  Concentration:  Concentration: Fair and Attention Span: Poor  Recall:  Good  Fund of Knowledge: Good  Language: Good  Assets:  Communication Skills Desire for Improvement Resilience Talents/Skills  ADL's:  Intact  Cognition: WNL  Prognosis:  Good    DIAGNOSES:    ICD-10-CM   1. Mild recurrent major depression (HCC)  F33.0 Vilazodone HCl (VIIBRYD) 20 MG TABS  2. Attention deficit hyperactivity disorder (ADHD), inattentive type, moderate  F90.0 amphetamine-dextroamphetamine (ADDERALL) 30 MG tablet    amphetamine-dextroamphetamine (ADDERALL) 30 MG tablet    amphetamine-dextroamphetamine (ADDERALL) 30 MG tablet  3. Social anxiety disorder  F40.10 Vilazodone HCl (VIIBRYD) 20 MG TABS    Receiving Psychotherapy: No though previously at [place] after [facility]    RECOMMENDATIONS: Psychosupportive psychoeducation addresses warnings and risks for prevention and monitoring, safety hygiene, and crisis interventions if needed for diagnoses and treatment inluding behavioral nutrition, anger management, and DBT social skills and problem solving.  Adderall 30 mg IR tablet is continued as 1 every morning and 1/2 every 3 PM sent as #45 each for  June 18, July 18, and August 17 sent to [facility] at [address] for ADHD  for recurrent depression and ADHD.  Viibryd 20 mg tablet as half tablet total 10 mg every morning to advance to 1 tablet total 20 mg every morning when tolerated and if still needed. He agrees to follow-up return in 3 months or sooner if needed.  Delight Hoh, MD

## 2018-10-02 IMAGING — CT CT PELVIS W/ CM
2 of 3 series · 17 of 46 positions shown, 19 images · IV contrast (APPLIED)
Comparison: None.

CLINICAL DATA: Rectal pain, minimal bleeding for 1 year

EXAM:
CT PELVIS WITH CONTRAST
TECHNIQUE: Multidetector CT imaging of the pelvis was performed using the
standard protocol following the bolus administration of intravenous
contrast.
CONTRAST:  100mL [id number] IOPAMIDOL ([id number]) INJECTION 61%

[Series 2: routine pelvis w/cm · axial · 0.79mm/px · z∈[+778,+1058]mm · 14 of 66 slices shown, 16 images]
[im 5/66  soft-tissue]
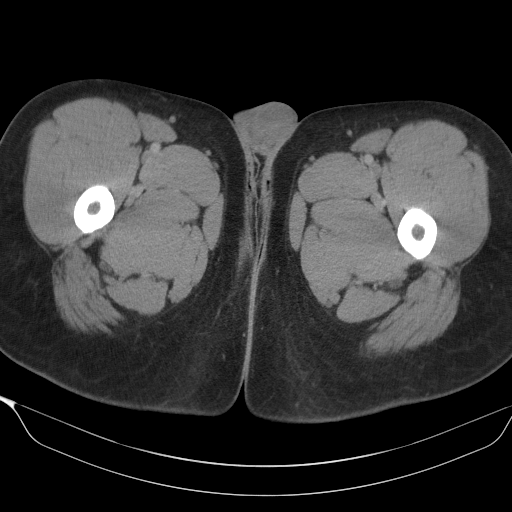
[im 5/66  bone]
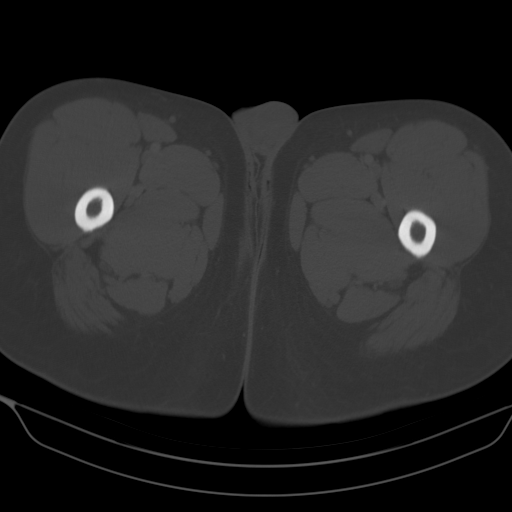
[im 9/66  soft-tissue]
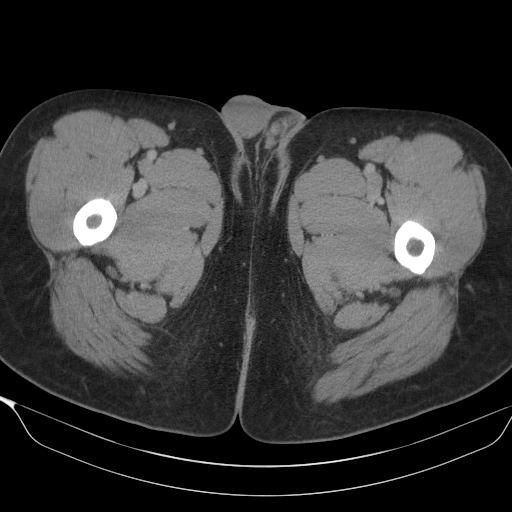
[im 13/66  soft-tissue]
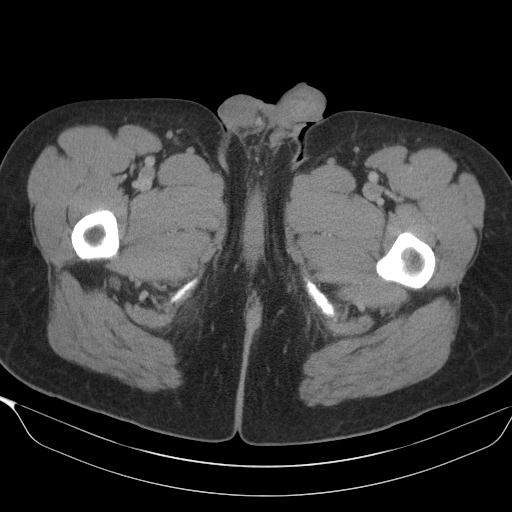
[im 17/66  soft-tissue]
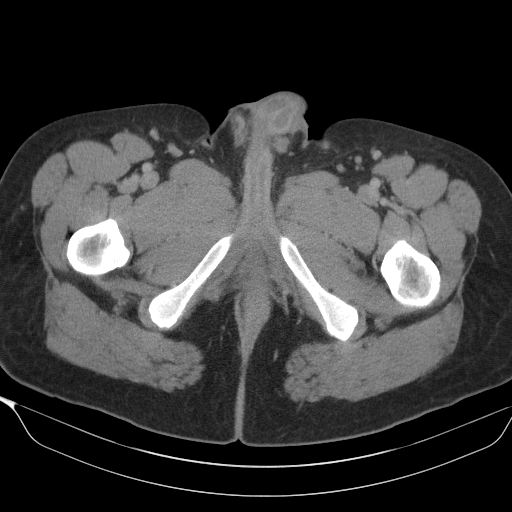
[im 21/66  soft-tissue]
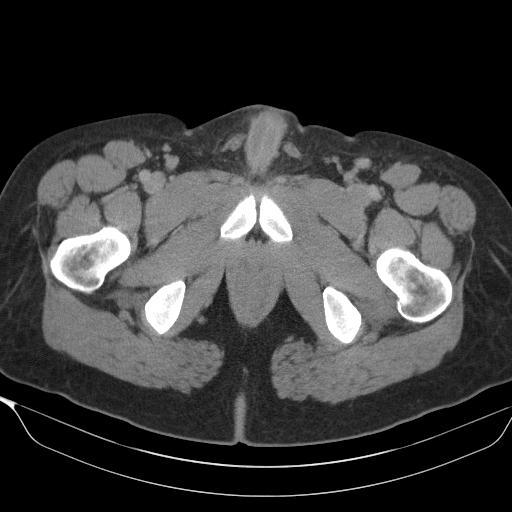
[im 26/66  soft-tissue]
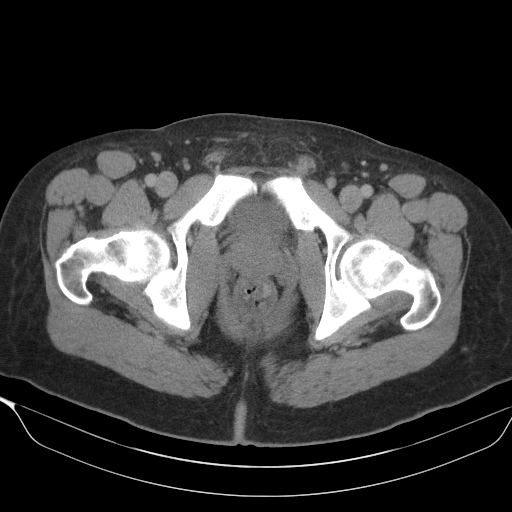
[im 30/66  soft-tissue]
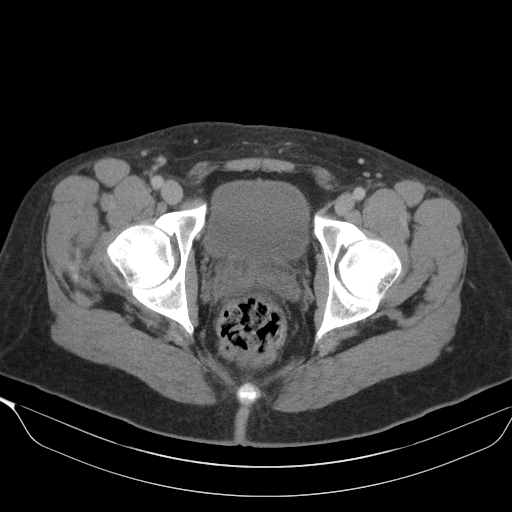
[im 36/66  soft-tissue]
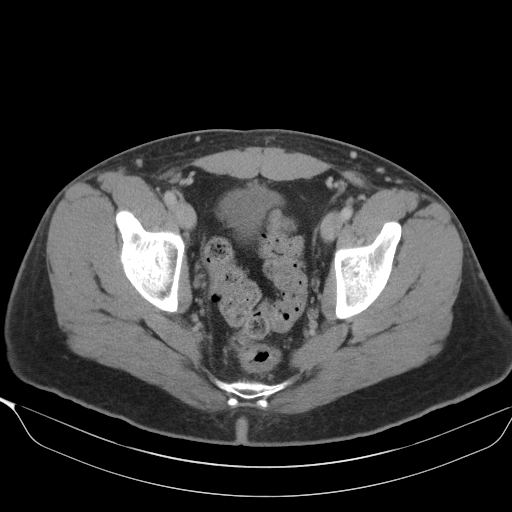
[im 40/66  soft-tissue]
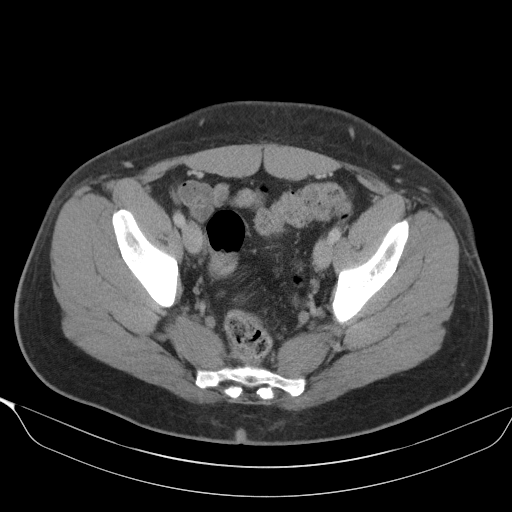
[im 40/66  bone]
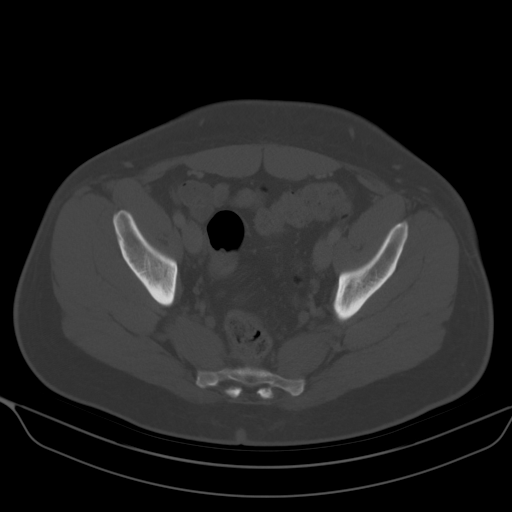
[im 45/66  soft-tissue]
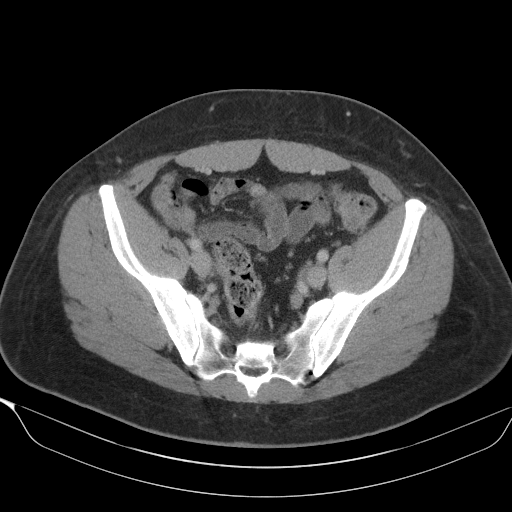
[im 49/66  soft-tissue]
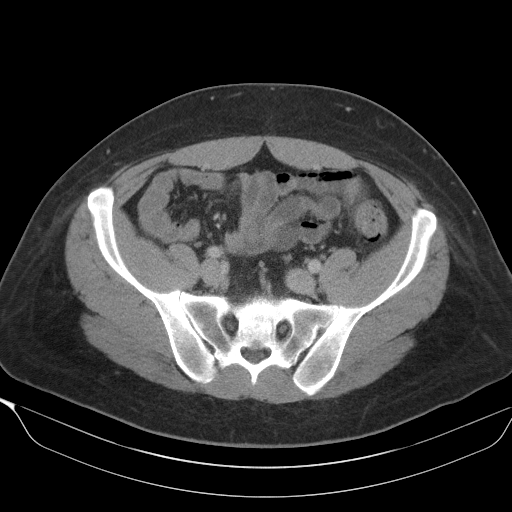
[im 53/66  soft-tissue]
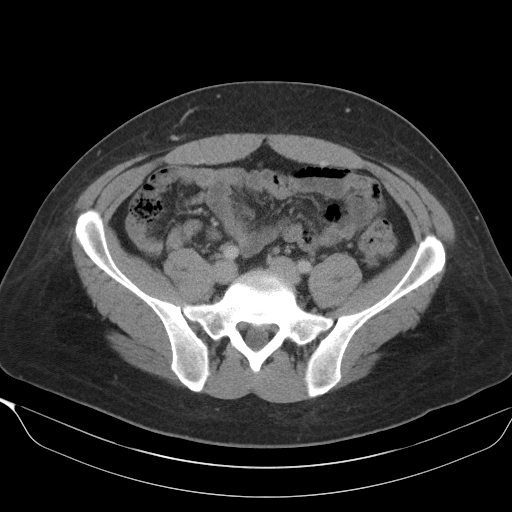
[im 57/66  soft-tissue]
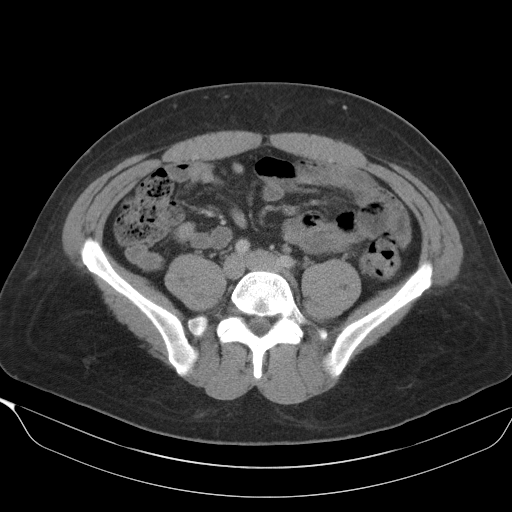
[im 61/66  soft-tissue]
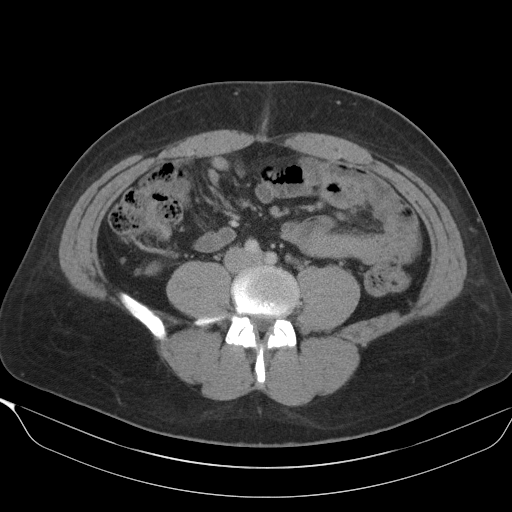

[Series 4: cor · coronal · 0.63mm/px · 3 of 97 slices shown]
[im 33/97  soft-tissue]
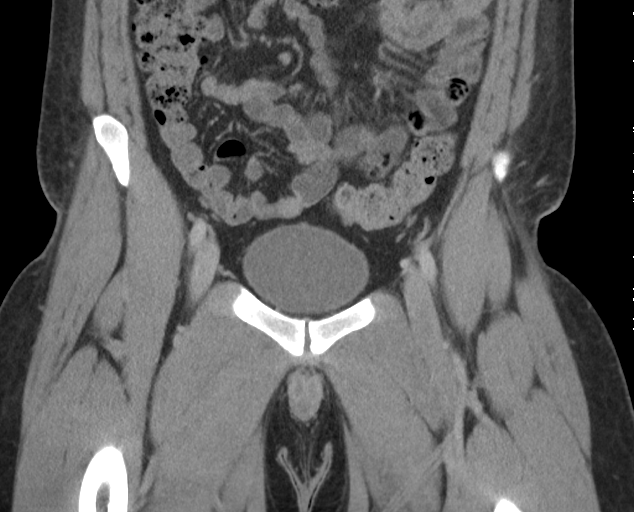
[im 43/97  soft-tissue]
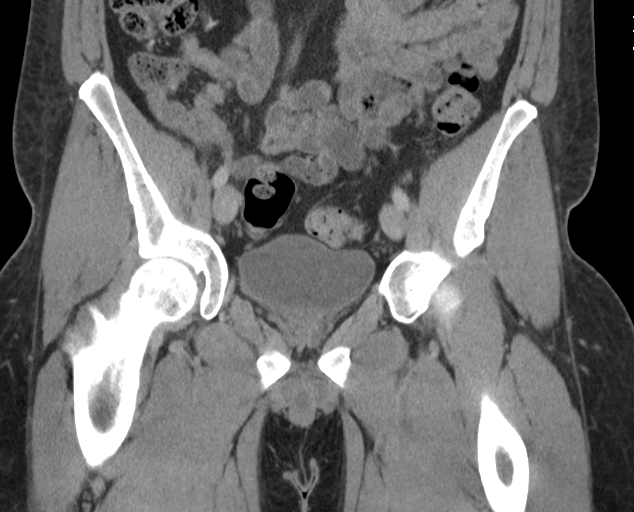
[im 54/97  soft-tissue]
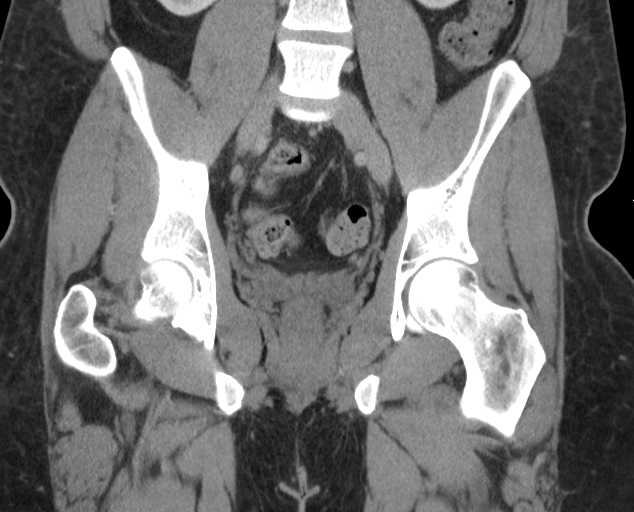

[17 of 46 positions shown; findings below may reference images not displayed]

FINDINGS: Urinary Tract:  No abnormality visualized.

Bowel:  Unremarkable visualized pelvic bowel loops.

Vascular/Lymphatic: No pathologically enlarged lymph nodes. No
significant vascular abnormality seen.

Reproductive:  No mass or other significant abnormality

Other: No focal fluid collection to suggest an abscess. No hematoma.
No inguinal hernia.

Musculoskeletal: No acute osseous abnormality. No lytic or sclerotic
osseous lesion.
IMPRESSION: 1. No acute abnormality of the pelvis. If there is further clinical
concern regarding a rectal mass, recommend direct visualization.

## 2018-10-29 ENCOUNTER — Ambulatory Visit: Payer: BC Managed Care – PPO | Admitting: Psychiatry

## 2018-11-25 ENCOUNTER — Other Ambulatory Visit: Payer: Self-pay

## 2018-11-25 DIAGNOSIS — Z20822 Contact with and (suspected) exposure to covid-19: Secondary | ICD-10-CM

## 2018-11-26 LAB — NOVEL CORONAVIRUS, NAA: SARS-CoV-2, NAA: NOT DETECTED

## 2018-12-15 ENCOUNTER — Other Ambulatory Visit: Payer: Self-pay

## 2018-12-15 DIAGNOSIS — Z20822 Contact with and (suspected) exposure to covid-19: Secondary | ICD-10-CM

## 2018-12-17 LAB — NOVEL CORONAVIRUS, NAA: SARS-CoV-2, NAA: NOT DETECTED

## 2019-01-21 ENCOUNTER — Ambulatory Visit (INDEPENDENT_AMBULATORY_CARE_PROVIDER_SITE_OTHER): Payer: BC Managed Care – PPO | Admitting: Psychiatry

## 2019-01-21 ENCOUNTER — Encounter: Payer: Self-pay | Admitting: Psychiatry

## 2019-01-21 ENCOUNTER — Other Ambulatory Visit: Payer: Self-pay

## 2019-01-21 VITALS — Ht 75.0 in | Wt 192.0 lb

## 2019-01-21 DIAGNOSIS — F900 Attention-deficit hyperactivity disorder, predominantly inattentive type: Secondary | ICD-10-CM

## 2019-01-21 DIAGNOSIS — F4010 Social phobia, unspecified: Secondary | ICD-10-CM | POA: Diagnosis not present

## 2019-01-21 DIAGNOSIS — F3341 Major depressive disorder, recurrent, in partial remission: Secondary | ICD-10-CM

## 2019-01-21 MED ORDER — AMPHETAMINE-DEXTROAMPHETAMINE 30 MG PO TABS: ORAL_TABLET | ORAL | 0 refills | Status: DC

## 2019-01-21 MED ORDER — VIIBRYD 20 MG PO TABS: 20.0000 mg | ORAL_TABLET | Freq: Every day | ORAL | 3 refills | Status: DC

## 2019-01-21 NOTE — Progress Notes (Signed)
[facility]  Patient ID: Lucas Herrera,  MRN: [id number]  PCP: Patient, No Pcp Per  Date of Evaluation: 01/21/2019 Time spent:15 minutes from 0945 to 1000  Chief Complaint:  Chief Complaint    Depression; ADHD; Anxiety      HISTORY/CURRENT STATUS: Lucas Herrera is seen onsite in office 15 minutes face-to-face individually with consent with epic collateral for psychiatric interview and exam and 61-month evaluation and management of depression, social anxiety, and ADHD.  He transferred previous [facility] and [facility] care to [facility] 11 months ago then myself after the decease of Anne Fu, last being only appointment with me 6 months ago.  Medication changes of the year continue to be applied and implemented so that he is consistently taking only one half of the 20 mg Trintellix every morning though requesting to maintain the option to increase to the 20 mg should partial remission of depression become relapsing.  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  He was tested for Covid twice both negative after coworker exposure.  [place] registry documents only one interim fill of the eScriptions June 18 being on July 28 at [facility] [address].  Therefore his social anxiety and previous depression seem to determine his maintenance of capacity for adequate care while his ADHD may determines the inconsistent application of such, though he does seem more consistent with the Trintellix.  He talks much more fully and comfortably today and reports after no therapy in the last 2 years, he may consider when his new job and finances are secure restarting therapy such as this office.  He has no mania, suicidality, psychosis, or delirium.   Depression        This is a recurrent problem.  The most recent episode started 1 year ago.   The onset quality  is sudden.   The problem occurs intermittently.  The problem has been gradually improving.  Associated symptoms include decreased concentration, decreased interest, and sad.  Associated symptoms include no hopelessness, does not have insomnia, no headaches, no appetite change,no ndigestion and no suicidal ideas.     The symptoms are aggravated by social issues, family issues and work stress.  Past treatments include SSRIs - Selective serotonin reuptake inhibitors, other medications and psychotherapy.  Compliance with treatment is variable.  Past compliance problems include difficulty with treatment plan and medication issues.  Previous treatment provided moderate relief.  Risk factors include a change in medication usage/dosage.   Individual Medical History/ Review of Systems: Changes? :No  Past and current psychiatric medications have included Adderall, Viibryd, Trintellix, Paxil, Wellbutrin, Zoloft.  Weight is down 7 pounds in 6 months but he considers this fluctuation normal throughout adult life.  Surgical outcome from December 2019 has been good for excision of painful bleeding internal hemorrhoid.  Allergies: Patient has no known allergies.  Current Medications:  Current Outpatient Medications:  .  amphetamine-dextroamphetamine (ADDERALL) 30 MG tablet, Take one tab total 30 mg every morning and 1/2 tab total 15 mg every 3 PM, Disp: 45 tablet, Rfl: 0 .  [START ON 02/20/2019] amphetamine-dextroamphetamine (ADDERALL) 30 MG tablet, Take one tab total 30 mg every morning and 1/2 tab total 15 mg every 3 PM, Disp: 45 tablet, Rfl: 0 .  [START ON 03/22/2019] amphetamine-dextroamphetamine (ADDERALL) 30 MG tablet, Take one tab total 30 mg every morning and 1/2 tab total 15 mg  every 3 PM, Disp: 45 tablet, Rfl: 0 .  Vilazodone HCl (VIIBRYD) 20 MG TABS, Take 1 tablet (20 mg total) by mouth daily after breakfast., Disp: 30 tablet, Rfl: 3   Medication Side Effects: none  Family Medical/ Social History: Changes? No  resolving past divorce with 1 son as he continues working in Nurse, learning disability [place] EXAM:  Height 6\' 3"  (1.905 m), weight 192 lb (87.1 kg).Body mass index is 24 kg/m. Muscle strengths and tone 5/5, postural reflexes and gait 0/0, and AIMS = 0 otherwise deferred for coronavirus shutdown  General Appearance: Casual, Meticulous and Well Groomed  Eye Contact:  Good  Speech:  Clear and Coherent, Normal Rate and Talkative  Volume:  Normal  Mood:  Anxious, Dysphoric and Euthymic  Affect:  Congruent, Inappropriate, Full Range and Anxious  Thought Process:  Coherent, Goal Directed, Irrelevant, Linear and Descriptions of Associations: Circumstantial  Orientation:  Full (Time, Place, and Person)  Thought Content: Obsessions and Rumination   Suicidal Thoughts:  No  Homicidal Thoughts:  No  Memory:  Immediate;   Good Remote;   Good  Judgement:  Good  Insight:  Fair  Psychomotor Activity:  Normal and Mannerisms  Concentration:  Concentration: Fair and Attention Span: Good to fair  Recall:  Good  Fund of Knowledge: Good  Language: Good  Assets:  Desire for Improvement Physical Health Talents/Skills Vocational/Educational  ADL's:  Intact  Cognition: WNL  Prognosis:  Good    DIAGNOSES:    ICD-10-CM   1. Recurrent major depression in partial remission (HCC)  F33.41 Vilazodone HCl (VIIBRYD) 20 MG TABS  2. Attention deficit hyperactivity disorder (ADHD), inattentive type, moderate  F90.0 Vilazodone HCl (VIIBRYD) 20 MG TABS    amphetamine-dextroamphetamine (ADDERALL) 30 MG tablet    amphetamine-dextroamphetamine (ADDERALL) 30 MG tablet    amphetamine-dextroamphetamine (ADDERALL) 30 MG tablet  3. Social anxiety disorder  F40.10 Vilazodone HCl (VIIBRYD) 20 MG TABS    Receiving Psychotherapy: No    RECOMMENDATIONS: Over 50% of the 15-minute face-to-face time is spent in 8 minutes of counseling and coordination of care integrating updating of past psychotherapies for current life  work and family relationships adapting to change with exposure response prevention learning from the past as social anxiety still requires security options.  He is E scribed Trintellix 20 mg every morning though currently taking 1/2 tablet total 10 mg after breakfast sent as #30 with 3 refills to [facility] [address] for depression, social anxiety, and ADHD.  He also is E scribed Adderall 30 mg tablet taking 1 every morning and one half every 3 PM sent as #45 each for December 10, January 9, and February 8 to [place] for ADHD really needing this on very busy and dynamically different days.  He returns for follow-up in 6 months or sooner if needed   Delight Hoh, MD

## 2019-01-25 ENCOUNTER — Telehealth: Payer: Self-pay

## 2019-01-25 NOTE — Telephone Encounter (Signed)
Prior authorization submitted and approved for Viibryd 20 mg tablets effective 01/22/2019-01/20/2022 through [place].    [facility] notified of approval.

## 2019-03-29 ENCOUNTER — Telehealth: Payer: Self-pay

## 2019-03-29 NOTE — Telephone Encounter (Signed)
Prior authorization submitted for Viibryd 20 mg through [place] [place] [id number] Scripts approved effective 03/24/2019-02/10/2098    Submitted through cover my meds

## 2019-04-19 ENCOUNTER — Other Ambulatory Visit: Payer: Self-pay | Admitting: Psychiatry

## 2019-04-19 DIAGNOSIS — F900 Attention-deficit hyperactivity disorder, predominantly inattentive type: Secondary | ICD-10-CM

## 2019-04-19 MED ORDER — AMPHETAMINE-DEXTROAMPHETAMINE 30 MG PO TABS: ORAL_TABLET | ORAL | 0 refills | Status: DC

## 2019-04-19 NOTE — Telephone Encounter (Signed)
Pt requesting a refill on his Adderall. Next appt 6/10. Fill at the [facility] [address].

## 2019-04-19 NOTE — Telephone Encounter (Signed)
Last appointment December 10 had 30-day Adderall prescriptions for December 10, January 9, and February 8.  [place] registry documents 2 dispensings of the 3 eScription's last on 03/08/2019 but pharmacy states they have dispensed all 3 of them none remaining on their computer.  Either way, patient needs a supply as discussed at [address] [facility] as reception recorded, clarifying with patient by phone who needs refill until follow-up in June medically necessary no contraindication

## 2019-05-31 ENCOUNTER — Telehealth: Payer: Self-pay | Admitting: Psychiatry

## 2019-05-31 ENCOUNTER — Other Ambulatory Visit: Payer: Self-pay

## 2019-05-31 DIAGNOSIS — F900 Attention-deficit hyperactivity disorder, predominantly inattentive type: Secondary | ICD-10-CM

## 2019-05-31 MED ORDER — AMPHETAMINE-DEXTROAMPHETAMINE 30 MG PO TABS: ORAL_TABLET | ORAL | 0 refills | Status: DC

## 2019-05-31 NOTE — Telephone Encounter (Signed)
Last refill 04/19/2019, pended for Dr. Marlyne Beards to submit Has apt 07/22/2019

## 2019-05-31 NOTE — Telephone Encounter (Signed)
Pt requests Refills of Adderall to be sent in to [facility].

## 2019-07-22 ENCOUNTER — Other Ambulatory Visit: Payer: Self-pay

## 2019-07-22 ENCOUNTER — Ambulatory Visit (INDEPENDENT_AMBULATORY_CARE_PROVIDER_SITE_OTHER): Payer: 59 | Admitting: Psychiatry

## 2019-07-22 ENCOUNTER — Encounter: Payer: Self-pay | Admitting: Psychiatry

## 2019-07-22 VITALS — Ht 75.0 in | Wt 198.0 lb

## 2019-07-22 DIAGNOSIS — F3341 Major depressive disorder, recurrent, in partial remission: Secondary | ICD-10-CM | POA: Diagnosis not present

## 2019-07-22 DIAGNOSIS — F900 Attention-deficit hyperactivity disorder, predominantly inattentive type: Secondary | ICD-10-CM | POA: Diagnosis not present

## 2019-07-22 DIAGNOSIS — F4010 Social phobia, unspecified: Secondary | ICD-10-CM | POA: Diagnosis not present

## 2019-07-22 MED ORDER — AMPHETAMINE-DEXTROAMPHETAMINE 30 MG PO TABS: ORAL_TABLET | ORAL | 0 refills | Status: DC

## 2019-07-22 MED ORDER — DULOXETINE HCL 30 MG PO CPEP: 30.0000 mg | ORAL_CAPSULE | Freq: Every day | ORAL | 5 refills | Status: DC

## 2019-07-22 NOTE — Progress Notes (Signed)
[facility]  Patient ID: Lucas Herrera,  MRN: [id number]  PCP: Patient, No Pcp Per  Date of Evaluation: 07/22/2019 Time spent:20 minutes from 1000 to 1020  Chief Complaint:  Chief Complaint    Depression; ADHD; Anxiety      HISTORY/CURRENT STATUS: [name] is seen onsite in office 20 minutes face-to-face individually with consent with epic collateral for psychiatric interview and exam in 64-month evaluation and management of recurrent major depression, social anxiety, and ADHD.  Consistent with the mixed diagnoses, the patient has now his third appointment with me in the last year after 2 appointments with Anne Fu, PA winter before last shortly before Clay's death, and has the history of medications at [facility] and possibly [facility] about which he is conflicted including Adderall, Viibryd, Trintellix, Paxil, Wellbutrin, and Zoloft.  He suggests the SSRI medications caused sexual dysfunction as have his other medications somewhat though less so.  At this time he disapproves of Trintellix stating he stopped it for significant side effects after several months. He now has stopped Viibryd around 3 weeks ago for cost over $250 monthly even if authorized. Wife has had insurance changes like himself now having marketplace undoing his past treatment except authorizing SNRI after the pharmacy notified this office that the patient needed Trintellix and Viibryd authorized.  Patient concludes today that he is not going to take either of these, he does agree with the need for long-term medication and is willing to consider the SNRI. Mostly, he hopes to start therapy again the end of June having an appointment reviewing his [facility] therapy as good but having conflicts with [facility] so he left that therapy.  He agrees to continue the Adderall as he reviews symptom hierarchy of inattentive executive dysfunction, guilt, obsessional anxiety, and overfocused fixations  in working from home environment when inattentive to demands of the moment. He as always has ambivalence about which medication he takes other than the Adderall, and he declines follow-up more frequently  than every 6 months.  He has 2 children and wife at home while needing the environment to support activities of all.  He has no mania, suicidality, psychosis or delirium.  Depression This is a recurrentproblem.The most recent episode started 18 months ago. The onset quality is sudden. The problem occurs intermittently.The problem has been gradually improving.Associated symptoms include social avoidance and oversensitivity, decreased concentration,decreased interest,and remorseful sadness. Associated symptoms include no hopelessness,no insomnia,no headaches, no appetite change, no indigestion,and no suicidal ideas.The symptoms are aggravated by social issues, family issues and work stress.Past treatments include SSRIs - Selective serotonin reuptake inhibitors, other medications and psychotherapy.Compliance with treatment is variable.Past compliance problems include difficulty with treatment plan and medication issues.Previous treatment provided moderaterelief.Risk factors include a change in medication usage/dosage.  Individual Medical History/ Review of Systems: Changes? :No   Allergies: Patient has no known allergies.  Current Medications:  Current Outpatient Medications:  .  [START ON 08/14/2019] amphetamine-dextroamphetamine (ADDERALL) 30 MG tablet, Take one tab total 30 mg every morning and 1/2 tab total 15 mg every 3 PM, Disp: 45 tablet, Rfl: 0 .  [START ON 09/13/2019] amphetamine-dextroamphetamine (ADDERALL) 30 MG tablet, Take one tab total 30 mg every morning and 1/2 tab total 15 mg every 3 PM, Disp: 45 tablet, Rfl: 0 .  [START ON 10/13/2019] amphetamine-dextroamphetamine (ADDERALL) 30 MG tablet, Take one tab total 30 mg every morning and 1/2 tab total 15 mg  every 3 PM, Disp: 45 tablet, Rfl: 0 .  DULoxetine (  CYMBALTA) 30 MG capsule, Take 1 capsule (30 mg total) by mouth daily after breakfast., Disp: 30 capsule, Rfl: 5  Medication Side Effects: sexual dysfunction  Family Medical/ Social History: Changes? No  MENTAL HEALTH EXAM:  Height 6\' 3"  (1.905 m), weight 198 lb (89.8 kg).Body mass index is 24.75 kg/m. Muscle strengths and tone 5/5, postural reflexes and gait 0/0, and AIMS = 0.  General Appearance: Casual, Fairly Groomed and Meticulous  Eye Contact:  Good  Speech:  Clear and Coherent, Normal Rate and Talkative  Volume:  Normal  Mood:  Anxious, Dysphoric and Euthymic  Affect:  Congruent, Depressed, Inappropriate, Restricted and Anxious  Thought Process:  Coherent, Goal Directed, Irrelevant, Linear and Descriptions of Associations: Circumstantial  Orientation:  Full (Time, Place, and Person)  Thought Content: Obsessions and Rumination   Suicidal Thoughts:  No  Homicidal Thoughts:  No  Memory:  Immediate;   Good Remote;   Good  Judgement:  Fair  Insight:  Fair  Psychomotor Activity:  Normal, Decreased, Mannerisms and Psychomotor Retardation  Concentration:  Concentration: Fair and Attention Span: Fair to Poor  Recall:  [employer] of Knowledge: Good  Language: Good  Assets:  Desire for Improvement Resilience Talents/Skills Vocational/Educational  ADL's:  Intact  Cognition: WNL  Prognosis:  Fair    DIAGNOSES:    ICD-10-CM   1. Recurrent major depression in partial remission (HCC)  F33.41 DULoxetine (CYMBALTA) 30 MG capsule  2. Attention deficit hyperactivity disorder (ADHD), inattentive type, moderate  F90.0 amphetamine-dextroamphetamine (ADDERALL) 30 MG tablet    amphetamine-dextroamphetamine (ADDERALL) 30 MG tablet    amphetamine-dextroamphetamine (ADDERALL) 30 MG tablet  3. Social anxiety disorder  F40.10 DULoxetine (CYMBALTA) 30 MG capsule    Receiving Psychotherapy: Yes having appointment for end of June for restarting  psychotherapy not able or comfortable today for disclosing therapist and location.   RECOMMENDATIONS: Psychosupportive psychoeducation integrates cognitive behavioral object relations, social skills, frustration management, and guilty attributions to treatment managing for medication having discontinued himself Trintellix and Viibryd agreeing today to start Cymbalta 30 mg every morning sent as #30 with 5 refills to [facility] at [address] for depression and social anxiety.  Adderall is E scribed 30 mg IR tablet taking 1 every morning and one half every 3 PM as #45 each for July 3, August 2, and September 1 as he picked up dispensed supply 07/15/2019 per [facility] registry just ahead of this appointment to not run out.  He returns for follow-up in 6 months or sooner if willing updated prevention and monitoring safety hygiene.   Delight Hoh, MD

## 2019-08-17 ENCOUNTER — Ambulatory Visit (INDEPENDENT_AMBULATORY_CARE_PROVIDER_SITE_OTHER): Payer: 59 | Admitting: Mental Health

## 2019-08-17 ENCOUNTER — Other Ambulatory Visit: Payer: Self-pay

## 2019-08-17 DIAGNOSIS — F900 Attention-deficit hyperactivity disorder, predominantly inattentive type: Secondary | ICD-10-CM

## 2019-08-17 DIAGNOSIS — F3341 Major depressive disorder, recurrent, in partial remission: Secondary | ICD-10-CM

## 2019-08-17 NOTE — Progress Notes (Signed)
[facility] Initial Adult Exam  Name: Lucas Herrera "Lucas Herrera" Date: 08/17/2019 MRN: [id number] DOB: [birth year] PCP: Patient, No Pcp Per  Time spent: 53 minutes  Reason for Visit /Presenting Problem: He reports coping w/ anxiety, went through a divorce about 2 years ago. Panic attacks ensued, and stopped a few months ago. The anxiety and ad/hd sx's persist. Wants to know the "root of why I hyper-focus on things (on computer/fix issues) and sometimes vacillate". Copes w/ social anxiety also, romantic relationships, specifically when he feels criticized. Has his own business, can be self conscious he if he criticized or if he perceives this will occur. 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  He remarried last year, has been staying home last year to help w/ the children's learning. Stepdaughter- age 72, son-age 21. Wife, Revonda Standard, have known each other the last few years, married about 1 year. His ex-wife tries to keep him from being involved but he has half custody of their son.  He stated his wife does not feel understood; he tries to be empathetic. He wants to work on validating his wife feelings versus getting defensive. He worries that others may talk behind his back, and example is when he went to a wedding and the next day he may question "was I too loud" or "did I say the wrong thing".  He identified wanting to be able to feel more vulnerable around others, not question if he is being criticized constantly.  Mental Status Exam:   Appearance:   Casual     Behavior:  Appropriate  Motor:  Normal  Speech/Language:   Clear and Coherent  Affect:  Full Range  Mood:  anxious  Thought process:  normal  Thought content:    WNL  Sensory/Perceptual disturbances:    WNL  Orientation:  x4  Attention:  Good  Concentration:  Good  Memory:  WNL  Fund of knowledge:   Good  Insight:    Good  Judgment:   Good  Impulse Control:  Good   Reported Symptoms:  Anxiety, distractible, intermittent depressed mood, rumination, negative self talk  Risk Assessment: Danger to Self:  No Self-injurious Behavior: No Danger to Others: No Duty to Warn:no Physical Aggression / Violence:No  Access to Firearms a concern: No  Gang Involvement:No  Patient / guardian was educated about steps to take if suicide or homicide risk level increases between visits: yes While future psychiatric events cannot be accurately predicted, the patient does not currently require acute inpatient psychiatric care and does not currently meet [facility] involuntary commitment criteria.   Medical History/Surgical History: Past Medical History:  Diagnosis Date  . Anal pain   . Depression   . Rectal mass   . Wears glasses     Past Surgical History:  Procedure Laterality Date  . EVALUATION UNDER ANESTHESIA WITH FISTULECTOMY N/A 02/09/2016   Procedure: EXAM UNDER ANESTHESIA with EXCISIONAL BIOPSY OF ANAL CANAL MASS;  Surgeon: Romie Levee, MD;  Location[facility];  Service: General;  Laterality: N/A;  . SIGMOIDOSCOPY  01/2015  . SPHINCTEROTOMY N/A 02/09/2016   Procedure: CHEMICAL SPHINCTEROTOMY;  Surgeon: Romie Levee, MD;  Location: [facility];  Service: General;  Laterality: N/A;    Medications: Current Outpatient Medications  Medication Sig Dispense Refill  . amphetamine-dextroamphetamine (ADDERALL) 30 MG tablet Take one tab total 30 mg every morning and 1/2 tab total 15 mg every 3 PM 45 tablet 0  . [START ON 09/13/2019]  amphetamine-dextroamphetamine (ADDERALL) 30 MG tablet Take one tab total 30 mg every morning and 1/2 tab total 15 mg every 3 PM 45 tablet 0  . [START ON 10/13/2019] amphetamine-dextroamphetamine (ADDERALL) 30 MG tablet Take one tab total 30 mg every morning and 1/2 tab total 15 mg every 3 PM 45 tablet 0  . DULoxetine (CYMBALTA) 30 MG capsule Take 1 capsule (30 mg total) by mouth daily after breakfast. 30 capsule 5   No  current facility-administered medications for this visit.    No Known Allergies  Diagnoses:    ICD-10-CM   1. Recurrent major depression in partial remission (HCC)  F33.41   2. Attention deficit hyperactivity disorder (ADHD), inattentive type, moderate  F90.0     Plan of Care: TBD   Waldron Session, [facility]

## 2019-09-09 ENCOUNTER — Other Ambulatory Visit: Payer: Self-pay

## 2019-09-09 ENCOUNTER — Ambulatory Visit (INDEPENDENT_AMBULATORY_CARE_PROVIDER_SITE_OTHER): Payer: 59 | Admitting: Mental Health

## 2019-09-09 DIAGNOSIS — F900 Attention-deficit hyperactivity disorder, predominantly inattentive type: Secondary | ICD-10-CM | POA: Diagnosis not present

## 2019-09-09 DIAGNOSIS — F3341 Major depressive disorder, recurrent, in partial remission: Secondary | ICD-10-CM | POA: Diagnosis not present
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
## 2019-10-13 ENCOUNTER — Other Ambulatory Visit: Payer: Self-pay

## 2019-10-13 ENCOUNTER — Ambulatory Visit (INDEPENDENT_AMBULATORY_CARE_PROVIDER_SITE_OTHER): Payer: 59 | Admitting: Mental Health

## 2019-10-13 DIAGNOSIS — F3341 Major depressive disorder, recurrent, in partial remission: Secondary | ICD-10-CM

## 2019-10-13 NOTE — Progress Notes (Signed)
[facility]  Name: IZYAN EZZELL "[name]" Date: 10/13/19 MRN: [id number] DOB: [birth year] PCP: Patient, No Pcp Per  Time spent: 53 minutes  Treatment:   Individual therapy  Mental Status Exam:   Appearance:   Casual     Behavior:  Appropriate  Motor:  Normal  Speech/Language:   Clear and Coherent  Affect:  Full Range  Mood:  anxious  Thought process:  normal  Thought content:    WNL  Sensory/Perceptual disturbances:    WNL  Orientation:  x4  Attention:  Good  Concentration:  Good  Memory:  WNL  Fund of knowledge:   Good  Insight:    Good  Judgment:   Good  Impulse Control:  Good   Reported Symptoms: Anxiety, distractible, intermittent depressed mood, rumination, negative self talk  Risk Assessment: Danger to Self:  No Self-injurious Behavior: No Danger to Others: No Duty to Warn:no Physical Aggression / Violence:No  Access to Firearms a concern: No  Gang Involvement:No  Patient / guardian was educated about steps to take if suicide or homicide risk level increases between visits: yes While future psychiatric events cannot be accurately predicted, the patient does not currently require acute inpatient psychiatric care and does not currently meet [facility] involuntary commitment criteria.   Medical History/Surgical History: Past Medical History:  Diagnosis Date  . Anal pain   . Depression   . Rectal mass   . Wears glasses     Past Surgical History:  Procedure Laterality Date  . EVALUATION UNDER ANESTHESIA WITH FISTULECTOMY N/A 02/09/2016   Procedure: EXAM UNDER ANESTHESIA with EXCISIONAL BIOPSY OF ANAL CANAL MASS;  Surgeon: Romie Levee, MD;  Location: [facility];  Service: General;  Laterality: N/A;  . SIGMOIDOSCOPY  01/2015  . SPHINCTEROTOMY N/A 02/09/2016   Procedure: CHEMICAL SPHINCTEROTOMY;  Surgeon: Romie Levee, MD;  Location: [facility];  Service: General;  Laterality: N/A;     Medications: Current Outpatient Medications  Medication Sig Dispense Refill  . amphetamine-dextroamphetamine (ADDERALL) 30 MG tablet Take one tab total 30 mg every morning and 1/2 tab total 15 mg every 3 PM 45 tablet 0  . amphetamine-dextroamphetamine (ADDERALL) 30 MG tablet Take one tab total 30 mg every morning and 1/2 tab total 15 mg every 3 PM 45 tablet 0  . amphetamine-dextroamphetamine (ADDERALL) 30 MG tablet Take one tab total 30 mg every morning and 1/2 tab total 15 mg every 3 PM 45 tablet 0  . DULoxetine (CYMBALTA) 30 MG capsule Take 1 capsule (30 mg total) by mouth daily after breakfast. 30 capsule 5   No current facility-administered medications for this visit.   Subjective:   Patient presents for session on time.  He shared recent progress, changes.  He stated that he learned recently as last week that his father passed away via suicide.  Most of the session was centered around events leading up to and surrounding his passing.  He stated his father coped with depression in some ways, that he is not surprised that this occurred due to the chronicity of his depression, however, his ex-wife has suddenly come back into the situation after they had been separated for many years.  She is the mother of their 60 year old who lived with patient's father for the last several years.  Patient stated that this ex-wife claimed that her name is on the deed of the house and this was mentioned quickly upon her returning and learning of his passing.  Patient shared feelings  various feelings, conflicted by confusing events surrounding his father's passing.  He verbalizes how he has not allowed himself to mourn due to these confusing events.  Patient is actively trying to piece together various events in order to have more of an understanding of the events surrounding his father's passing.  He has struggled to maintain enough sleep and comments on wearing the same clothes for the past couple of days.   Encouraged him to focus on self-care, utilizing his support system which includes his wife who he states has been highly supportive.  Provide support, understanding throughout while continuing to encourage patient to make an effort to care for himself during this time.  Diagnoses:    ICD-10-CM   1. Recurrent major depression in partial remission (HCC)  F33.41     Plan: Patient is to use CBT, mindfulness and coping skills to help manage decrease symptoms associated with their diagnosis.    Long-term goal:   Reduce overall level, frequency, and intensity  of his symptoms up to 80% of the time severity for at least 3 consecutive months.   Short-term goal:  Decrease "deflating, self-doubting" and catastrophizing thinking style Decrease anxiety producing self talk such as thinking of the worse possible life outcome Decrease ruminating, specifically regarding his communication in his marital relationship Improved communication in his marital relationship utilizing active listening   Assessment of progress:  progressing   Waldron Session, [facility]

## 2019-10-19 ENCOUNTER — Ambulatory Visit: Payer: 59 | Admitting: Internal Medicine

## 2019-10-21 ENCOUNTER — Ambulatory Visit (INDEPENDENT_AMBULATORY_CARE_PROVIDER_SITE_OTHER): Payer: 59 | Admitting: Internal Medicine

## 2019-10-21 ENCOUNTER — Encounter: Payer: Self-pay | Admitting: Internal Medicine

## 2019-10-21 ENCOUNTER — Other Ambulatory Visit: Payer: Self-pay

## 2019-10-21 VITALS — BP 110/78 | HR 85 | Temp 98.3°F | Ht 75.0 in | Wt 193.6 lb

## 2019-10-21 DIAGNOSIS — Z23 Encounter for immunization: Secondary | ICD-10-CM

## 2019-10-21 DIAGNOSIS — K602 Anal fissure, unspecified: Secondary | ICD-10-CM

## 2019-10-21 DIAGNOSIS — F909 Attention-deficit hyperactivity disorder, unspecified type: Secondary | ICD-10-CM

## 2019-10-21 DIAGNOSIS — F329 Major depressive disorder, single episode, unspecified: Secondary | ICD-10-CM

## 2019-10-21 DIAGNOSIS — F32A Depression, unspecified: Secondary | ICD-10-CM

## 2019-10-21 DIAGNOSIS — L723 Sebaceous cyst: Secondary | ICD-10-CM

## 2019-10-21 DIAGNOSIS — F411 Generalized anxiety disorder: Secondary | ICD-10-CM | POA: Insufficient documentation

## 2019-10-21 NOTE — Patient Instructions (Signed)
-  Nice seeing you today!!  -Lab work today; will notify you once results are available.  -See you back in 6 months.  -referral to general surgery has been placed.

## 2019-10-21 NOTE — Progress Notes (Signed)
New Patient Office Visit     This visit occurred during the SARS-CoV-2 public health emergency.  Safety protocols were in place, including screening questions prior to the visit, additional usage of staff PPE, and extensive cleaning of exam room while observing appropriate contact time as indicated for disinfecting solutions.    CC/Reason for Visit: Establish care, discuss chronic conditions and some acute concerns Previous PCP: None Last Visit: Unknown  HPI: CASTEN FLOREN is a 34 y.o. male who is coming in today for the above mentioned reasons. Past Medical History is significant for: History of depression/anxiety as well as ADHD on Adderall and Cymbalta followed by psychiatry routinely.  He feels significant anxiety stable.  Unfortunately his father committed suicide 2 weeks ago.  He is concerned about skin frailty and significant bruising.  He is very active in sports, does roller [place] routinely.  He does not smoke, he drinks alcohol occasionally, he smokes marijuana almost on a daily basis.  His family history significant for mother with coronary artery disease and MI x2, as above his father committed suicide 2 weeks ago.  He is married, has a biological son and is also raising his wife's daughter.  He would like me to look at a cyst on his chest.  He also has had a anal skin tag removal surgery and feels like it is flaring up again.  He is requesting lab work   Past Medical/Surgical History: Past Medical History:  Diagnosis Date  . ADHD   . Anal pain   . Depression   . Depression   . GAD (generalized anxiety disorder)   . Rectal mass   . Wears glasses     Past Surgical History:  Procedure Laterality Date  . EVALUATION UNDER ANESTHESIA WITH FISTULECTOMY N/A 02/09/2016   Procedure: EXAM UNDER ANESTHESIA with EXCISIONAL BIOPSY OF ANAL CANAL MASS;  Surgeon: Romie Levee, MD;  Location: [facility];  Service: General;  Laterality: N/A;  . SIGMOIDOSCOPY   01/2015  . SPHINCTEROTOMY N/A 02/09/2016   Procedure: CHEMICAL SPHINCTEROTOMY;  Surgeon: Romie Levee, MD;  Location: [facility];  Service: General;  Laterality: N/A;    Social History:  reports that he has never smoked. He has never used smokeless tobacco. He reports current alcohol use of about 3.0 standard drinks of alcohol per week. He reports current drug use. Drug: Marijuana.  Allergies: No Known Allergies  Family History:  Family History  Problem Relation Age of Onset  . CAD Mother   . Suicidality Father      Current Outpatient Medications:  .  amphetamine-dextroamphetamine (ADDERALL) 30 MG tablet, Take one tab total 30 mg every morning and 1/2 tab total 15 mg every 3 PM, Disp: 45 tablet, Rfl: 0 .  amphetamine-dextroamphetamine (ADDERALL) 30 MG tablet, Take one tab total 30 mg every morning and 1/2 tab total 15 mg every 3 PM, Disp: 45 tablet, Rfl: 0 .  amphetamine-dextroamphetamine (ADDERALL) 30 MG tablet, Take one tab total 30 mg every morning and 1/2 tab total 15 mg every 3 PM, Disp: 45 tablet, Rfl: 0 .  DULoxetine (CYMBALTA) 30 MG capsule, Take 1 capsule (30 mg total) by mouth daily after breakfast., Disp: 30 capsule, Rfl: 5  Review of Systems:  Constitutional: Denies fever, chills, diaphoresis, appetite change and fatigue.  HEENT: Denies photophobia, eye pain, redness, hearing loss, ear pain, congestion, sore throat, rhinorrhea, sneezing, mouth sores, trouble swallowing, neck pain, neck stiffness and tinnitus.   Respiratory: Denies SOB,  DOE, cough, chest tightness,  and wheezing.   Cardiovascular: Denies chest pain, palpitations and leg swelling.  Gastrointestinal: Denies nausea, vomiting, abdominal pain, diarrhea, constipation, blood in stool and abdominal distention.  Genitourinary: Denies dysuria, urgency, frequency, hematuria, flank pain and difficulty urinating.  Endocrine: Denies: hot or cold intolerance, sweats, changes in hair or nails, polyuria,  polydipsia. Musculoskeletal: Denies myalgias, back pain, joint swelling, arthralgias and gait problem.  Skin: Denies pallor, rash and wound.  Neurological: Denies dizziness, seizures, syncope, weakness, light-headedness, numbness and headaches.  Hematological: Denies adenopathy. Easy bruising, personal or family bleeding history  Psychiatric/Behavioral: Denies suicidal ideation, confusion and agitation    Physical Exam: Vitals:   10/21/19 0811  BP: 110/78  Pulse: 85  Temp: 98.3 F (36.8 C)  TempSrc: Oral  SpO2: 99%  Weight: 193 lb 9.6 oz (87.8 kg)  Height: 6\' 3"  (1.905 m)   Body mass index is 24.2 kg/m.  Constitutional: NAD, calm, comfortable Eyes: PERRL, lids and conjunctivae normal, wears corrective lenses ENMT: Mucous membranes are moist.  Neck: normal, supple, no masses, no thyromegaly Respiratory: clear to auscultation bilaterally, no wheezing, no crackles. Normal respiratory effort. No accessory muscle use.  Cardiovascular: Regular rate and rhythm, no murmurs / rubs / gallops. No extremity edema.  Abdomen: no tenderness, no masses palpated. No hepatosplenomegaly. Bowel sounds positive.  Rectal exam: No visible lesion to external anal area, with chaperone present, finger was inserted into anal vault no evidence of fissure, drainage, mass, hemorrhoid. Skin: Small sebaceous cyst in his upper chest wall, some bruises in different stages of healing over his anterior shins. Neurologic: Grossly intact and nonfocal Psychiatric: Normal judgment and insight. Alert and oriented x 3. Normal mood.    Impression and Plan:  Depression, unspecified depression type Attention deficit hyperactivity disorder (ADHD), unspecified ADHD type GAD (generalized anxiety disorder) -Followed by psychiatry. -He has been seeing his counselor on a biweekly basis since his father's suicide 2 weeks ago.  Anal fissure -No evidence of mass, fissure, drainage on digital exam today.  - Plan: Ambulatory  referral to General Surgery per patient request, Dr. did his surgery.  Needs flu shot  - Plan: Flu Vaccine QUAD 6+ mos PF IM (Fluarix Quad PF)  Sebaceous cyst -Small, not infected or draining, advised observation for now.    Patient Instructions  -Nice seeing you today!!  -Lab work today; will notify you once results are available.  -See you back in 6 months.  -referral to general surgery has been placed.       Romie Levee, MD [facility]

## 2019-10-22 ENCOUNTER — Encounter: Payer: Self-pay | Admitting: Internal Medicine

## 2019-10-22 ENCOUNTER — Other Ambulatory Visit: Payer: Self-pay | Admitting: Internal Medicine

## 2019-10-22 DIAGNOSIS — E559 Vitamin D deficiency, unspecified: Secondary | ICD-10-CM | POA: Insufficient documentation

## 2019-10-22 LAB — CBC WITH DIFFERENTIAL/PLATELET
Absolute Monocytes: 431 cells/uL (ref 200–950)
Basophils Absolute: 18 cells/uL (ref 0–200)
Basophils Relative: 0.3 %
Eosinophils Absolute: 18 cells/uL (ref 15–500)
Eosinophils Relative: 0.3 %
HCT: 46.1 % (ref 38.5–50.0)
Hemoglobin: 15.7 g/dL (ref 13.2–17.1)
Lymphs Abs: 2018 cells/uL (ref 850–3900)
MCH: 32.2 pg (ref 27.0–33.0)
MCHC: 34.1 g/dL (ref 32.0–36.0)
MCV: 94.5 fL (ref 80.0–100.0)
MPV: 10.4 fL (ref 7.5–12.5)
Monocytes Relative: 7.3 %
Neutro Abs: 3416 cells/uL (ref 1500–7800)
Neutrophils Relative %: 57.9 %
Platelets: 258 10*3/uL (ref 140–400)
RBC: 4.88 10*6/uL (ref 4.20–5.80)
RDW: 11.8 % (ref 11.0–15.0)
Total Lymphocyte: 34.2 %
WBC: 5.9 10*3/uL (ref 3.8–10.8)

## 2019-10-22 LAB — COMPREHENSIVE METABOLIC PANEL
AG Ratio: 1.8 (calc) (ref 1.0–2.5)
ALT: 16 U/L (ref 9–46)
AST: 22 U/L (ref 10–40)
Albumin: 4.4 g/dL (ref 3.6–5.1)
Alkaline phosphatase (APISO): 47 U/L (ref 36–130)
BUN: 10 mg/dL (ref 7–25)
CO2: 31 mmol/L (ref 20–32)
Calcium: 9.5 mg/dL (ref 8.6–10.3)
Chloride: 102 mmol/L (ref 98–110)
Creat: 0.76 mg/dL (ref 0.60–1.35)
Globulin: 2.5 g/dL (calc) (ref 1.9–3.7)
Glucose, Bld: 86 mg/dL (ref 65–99)
Potassium: 4.3 mmol/L (ref 3.5–5.3)
Sodium: 140 mmol/L (ref 135–146)
Total Bilirubin: 0.6 mg/dL (ref 0.2–1.2)
Total Protein: 6.9 g/dL (ref 6.1–8.1)

## 2019-10-22 LAB — LIPID PANEL
Cholesterol: 172 mg/dL (ref <200)
HDL: 68 mg/dL (ref >=40)
LDL Cholesterol (Calc): 89 mg/dL (calc)
Non-HDL Cholesterol (Calc): 104 mg/dL (calc) (ref <130)
Total CHOL/HDL Ratio: 2.5 (calc) (ref <5.0)
Triglycerides: 67 mg/dL (ref <150)

## 2019-10-22 LAB — HEMOGLOBIN A1C
Hgb A1c MFr Bld: 4.8 % of total Hgb (ref <5.7)
Mean Plasma Glucose: 91 (calc)
eAG (mmol/L): 5 (calc)

## 2019-10-22 LAB — TSH: TSH: 1.91 mIU/L (ref 0.40–4.50)

## 2019-10-22 LAB — VITAMIN B12: Vitamin B-12: 284 pg/mL (ref 200–1100)

## 2019-10-22 LAB — VITAMIN D 25 HYDROXY (VIT D DEFICIENCY, FRACTURES): Vit D, 25-Hydroxy: 27 ng/mL — ABNORMAL LOW (ref 30–100)

## 2019-10-22 MED ORDER — VITAMIN D (ERGOCALCIFEROL) 1.25 MG (50000 UNIT) PO CAPS: 50000.0000 [IU] | ORAL_CAPSULE | ORAL | 0 refills | Status: AC

## 2019-10-27 ENCOUNTER — Ambulatory Visit (INDEPENDENT_AMBULATORY_CARE_PROVIDER_SITE_OTHER): Payer: 59 | Admitting: Mental Health

## 2019-10-27 ENCOUNTER — Other Ambulatory Visit: Payer: Self-pay

## 2019-10-27 DIAGNOSIS — F3341 Major depressive disorder, recurrent, in partial remission: Secondary | ICD-10-CM

## 2019-10-27 NOTE — Progress Notes (Signed)
[facility]  Name: Emerson Schreifels "[name]" Date: 10/27/19 MRN: [id number] DOB: Jan 04, 1986 PCP: Philip Aspen, Limmie Patricia, MD  Time spent: 53 minutes  Treatment:   Individual therapy  Mental Status Exam:   Appearance:   Casual     Behavior:  Appropriate  Motor:  Normal  Speech/Language:   Clear and Coherent  Affect:  Full Range  Mood:  anxious  Thought process:  normal  Thought content:    WNL  Sensory/Perceptual disturbances:    WNL  Orientation:  x4  Attention:  Good  Concentration:  Good  Memory:  WNL  Fund of knowledge:   Good  Insight:    Good  Judgment:   Good  Impulse Control:  Good   Reported Symptoms: Anxiety, distractible, intermittent depressed mood, rumination, negative self talk  Risk Assessment: Danger to Self:  No Self-injurious Behavior: No Danger to Others: No Duty to Warn:no Physical Aggression / Violence:No  Access to Firearms a concern: No  Gang Involvement:No  Patient / guardian was educated about steps to take if suicide or homicide risk level increases between visits: yes While future psychiatric events cannot be accurately predicted, the patient does not currently require acute inpatient psychiatric care and does not currently meet [facility] involuntary commitment criteria.   Medical History/Surgical History: Past Medical History:  Diagnosis Date   ADHD    Anal pain    Depression    Depression    GAD (generalized anxiety disorder)    Rectal mass    Wears glasses     Past Surgical History:  Procedure Laterality Date   EVALUATION UNDER ANESTHESIA WITH FISTULECTOMY N/A 02/09/2016   Procedure: EXAM UNDER ANESTHESIA with EXCISIONAL BIOPSY OF ANAL CANAL MASS;  Surgeon: Romie Levee, MD;  Location: [facility] [place];  Service: General;  Laterality: N/A;   SIGMOIDOSCOPY  01/2015   SPHINCTEROTOMY N/A 02/09/2016   Procedure: CHEMICAL SPHINCTEROTOMY;  Surgeon: Romie Levee, MD;   Location: [facility];  Service: General;  Laterality: N/A;    Medications: Current Outpatient Medications  Medication Sig Dispense Refill   amphetamine-dextroamphetamine (ADDERALL) 30 MG tablet Take one tab total 30 mg every morning and 1/2 tab total 15 mg every 3 PM 45 tablet 0   amphetamine-dextroamphetamine (ADDERALL) 30 MG tablet Take one tab total 30 mg every morning and 1/2 tab total 15 mg every 3 PM 45 tablet 0   amphetamine-dextroamphetamine (ADDERALL) 30 MG tablet Take one tab total 30 mg every morning and 1/2 tab total 15 mg every 3 PM 45 tablet 0   DULoxetine (CYMBALTA) 30 MG capsule Take 1 capsule (30 mg total) by mouth daily after breakfast. 30 capsule 5   Vitamin D, Ergocalciferol, (DRISDOL) 1.25 MG (50000 UNIT) CAPS capsule Take 1 capsule (50,000 Units total) by mouth every 7 (seven) days for 12 doses. 12 capsule 0   No current facility-administered medications for this visit.   Subjective:   Patient presents for session on time sharing progress.  He shared how he has coped with the loss of his father over the last 2 weeks.  He stated that he feels he still in some level of denial.  Feels devoid of emotion at times, other times uncontrollable crying.  He identified feelings of isolation, struggles at times with motivation.  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  He stated that he and  his ex-wife have had their struggles, some arguments recently, stating how she has been supportive but also copes with her own mental health issues.  He continues to have contact with his brother where they have been supportive of 1 another during this time.  He continues to express confusion and feelings of frustration due to his stepmother now living at his father's house, as she has not for several years.  He stated that his younger stepbrother is living with her and  he is let him know that he can stay with him or his other brother if needed during this time.  He shared how he is also getting more rest than he was initially.  Provide support and understanding throughout as he shared progress and changes.  Provide him resource to facilitate journaling between sessions.  Throughout session, assisted him in identifying and processing feelings he is having through the loss of his father.  Diagnoses:    ICD-10-CM   1. Recurrent major depression in partial remission (HCC)  F33.41     Plan: Patient is to use CBT, mindfulness and coping skills to help manage decrease symptoms associated with their diagnosis.    Long-term goal:   Reduce overall level, frequency, and intensity  of his symptoms up to 80% of the time severity for at least 3 consecutive months.   Short-term goal:  Decrease "deflating, self-doubting" and catastrophizing thinking style Decrease anxiety producing self talk such as thinking of the worse possible life outcome Decrease ruminating, specifically regarding his communication in his marital relationship Improved communication in his marital relationship utilizing active listening   Assessment of progress:  progressing   Waldron Session, [facility]

## 2019-11-01 ENCOUNTER — Telehealth: Payer: Self-pay | Admitting: Psychiatry

## 2019-11-01 NOTE — Telephone Encounter (Signed)
Pt would like a refill on Adderall. Please send to [facility] on [address].

## 2019-11-01 NOTE — Telephone Encounter (Signed)
Patient should still have 1 refill on file for Sept

## 2019-11-09 ENCOUNTER — Other Ambulatory Visit: Payer: Self-pay

## 2019-11-09 ENCOUNTER — Ambulatory Visit (INDEPENDENT_AMBULATORY_CARE_PROVIDER_SITE_OTHER): Payer: 59 | Admitting: Mental Health

## 2019-11-09 DIAGNOSIS — F900 Attention-deficit hyperactivity disorder, predominantly inattentive type: Secondary | ICD-10-CM | POA: Diagnosis not present

## 2019-11-09 DIAGNOSIS — F3341 Major depressive disorder, recurrent, in partial remission: Secondary | ICD-10-CM | POA: Diagnosis not present

## 2019-11-09 NOTE — Progress Notes (Signed)
[facility]  Name: Lucas Herrera "Lucas Herrera" Date: 11/09/19 MRN: [id number] DOB: 31-Jul-1985 PCP: Philip Aspen, Limmie Patricia, MD  Time spent: 53 minutes  Treatment:   Individual therapy  Mental Status Exam:   Appearance:   Casual     Behavior:  Appropriate  Motor:  Normal  Speech/Language:   Clear and Coherent  Affect:  Full Range  Mood:  anxious  Thought process:  normal  Thought content:    WNL  Sensory/Perceptual disturbances:    WNL  Orientation:  x4  Attention:  Good  Concentration:  Good  Memory:  WNL  Fund of knowledge:   Good  Insight:    Good  Judgment:   Good  Impulse Control:  Good   Reported Symptoms: Anxiety, distractible, intermittent depressed mood, rumination, negative self talk  Risk Assessment: Danger to Self:  No Self-injurious Behavior: No Danger to Others: No Duty to Warn:no Physical Aggression / Violence:No  Access to Firearms a concern: No  Gang Involvement:No  Patient / guardian was educated about steps to take if suicide or homicide risk level increases between visits: yes While future psychiatric events cannot be accurately predicted, the patient does not currently require acute inpatient psychiatric care and does not currently meet [facility] involuntary commitment criteria.   Medical History/Surgical History: Past Medical History:  Diagnosis Date  . ADHD   . Anal pain   . Depression   . Depression   . GAD (generalized anxiety disorder)   . Rectal mass   . Wears glasses     Past Surgical History:  Procedure Laterality Date  . EVALUATION UNDER ANESTHESIA WITH FISTULECTOMY N/A 02/09/2016   Procedure: EXAM UNDER ANESTHESIA with EXCISIONAL BIOPSY OF ANAL CANAL MASS;  Surgeon: Romie Levee, MD;  Location: [facility];  Service: General;  Laterality: N/A;  . SIGMOIDOSCOPY  01/2015  . SPHINCTEROTOMY N/A 02/09/2016   Procedure: CHEMICAL SPHINCTEROTOMY;  Surgeon: Romie Levee, MD;   Location: [facility];  Service: General;  Laterality: N/A;    Medications: Current Outpatient Medications  Medication Sig Dispense Refill  . amphetamine-dextroamphetamine (ADDERALL) 30 MG tablet Take one tab total 30 mg every morning and 1/2 tab total 15 mg every 3 PM 45 tablet 0  . amphetamine-dextroamphetamine (ADDERALL) 30 MG tablet Take one tab total 30 mg every morning and 1/2 tab total 15 mg every 3 PM 45 tablet 0  . amphetamine-dextroamphetamine (ADDERALL) 30 MG tablet Take one tab total 30 mg every morning and 1/2 tab total 15 mg every 3 PM 45 tablet 0  . DULoxetine (CYMBALTA) 30 MG capsule Take 1 capsule (30 mg total) by mouth daily after breakfast. 30 capsule 5  . Vitamin D, Ergocalciferol, (DRISDOL) 1.25 MG (50000 UNIT) CAPS capsule Take 1 capsule (50,000 Units total) by mouth every 7 (seven) days for 12 doses. 12 capsule 0   No current facility-administered medications for this visit.   Subjective:   Patient presents for session on time.  He continues to grieve the loss of his father.  He stated that recently he has had less motivation, sleeping more often later in the morning.  He stated that he lost his work week this week due to his decreased functioning.  He stated that he can get to sleep at night but has not prioritize getting to sleep on time, he plans to do so over the next few days.  He stated over the last 2 weeks he started using alcohol as a way to  cope with the feelings.  She had some outcomes, having an argument with his wife while intoxicated, feeling more emotionally out of control, crying spells.  He plans to not drink again, tries to remind himself that this will not help him and is negatively affecting his relationship with his wife.  He identified through guided discovery, feeling alone, wanting friends to reach out to be supportive during this time but that they are not doing so as often as he would like.  He shared how he tries to take a moment, think  through his thoughts that make him feel angry and upset.  Feels he has a tendency toward wanting to stay angry, this predates the loss of his father.  Encouraged him to further explore through journaling what is allowed him to keep a relationship with anger maintained.  Discussed STOPP coping skill to utilize between sessions.  Interventions: CBT, supportive therapy  Diagnoses:    ICD-10-CM   1. Recurrent major depression in partial remission (HCC)  F33.41   2. Attention deficit hyperactivity disorder (ADHD), inattentive type, moderate  F90.0     Plan: Patient is to use CBT, mindfulness and coping skills to help manage decrease symptoms associated with their diagnosis.    Long-term goal:   Reduce overall level, frequency, and intensity  of his symptoms up to 80% of the time severity for at least 3 consecutive months.  Patient to continue to abstain from using alcohol.  Patient to journal between sessions.  Patient to continue to utilize his support system and work on his marital relationship through open communication and allowing himself to receive support.   Short-term goal:  Decrease "deflating, self-doubting" and catastrophizing thinking style Decrease anxiety producing self talk such as thinking of the worse possible life outcome Decrease ruminating, specifically regarding his communication in his marital relationship Improved communication in his marital relationship utilizing active listening   Assessment of progress:  progressing   Waldron Session, [facility]

## 2019-11-16 ENCOUNTER — Ambulatory Visit (INDEPENDENT_AMBULATORY_CARE_PROVIDER_SITE_OTHER): Payer: 59 | Admitting: Mental Health

## 2019-11-16 ENCOUNTER — Other Ambulatory Visit: Payer: Self-pay

## 2019-11-16 DIAGNOSIS — F902 Attention-deficit hyperactivity disorder, combined type: Secondary | ICD-10-CM

## 2019-11-16 DIAGNOSIS — F3341 Major depressive disorder, recurrent, in partial remission: Secondary | ICD-10-CM

## 2019-11-16 NOTE — Progress Notes (Signed)
[facility]  Name: Lucas Herrera "[name]" Date: 11/16/19 MRN: [id number] DOB: Apr 02, 1985 PCP: Philip Aspen, Limmie Patricia, MD  Time spent: 53 minutes  Treatment:   Individual therapy  Mental Status Exam:   Appearance:   Casual     Behavior:  Appropriate  Motor:  Normal  Speech/Language:   Clear and Coherent  Affect:  Full Range  Mood:  anxious  Thought process:  normal  Thought content:    WNL  Sensory/Perceptual disturbances:    WNL  Orientation:  x4  Attention:  Good  Concentration:  Good  Memory:  WNL  Fund of knowledge:   Good  Insight:    Good  Judgment:   Good  Impulse Control:  Good   Reported Symptoms: Anxiety, distractible, intermittent depressed mood, rumination, negative self talk  Risk Assessment: Danger to Self:  No Self-injurious Behavior: No Danger to Others: No Duty to Warn:no Physical Aggression / Violence:No  Access to Firearms a concern: No  Gang Involvement:No  Patient / guardian was educated about steps to take if suicide or homicide risk level increases between visits: yes While future psychiatric events cannot be accurately predicted, the patient does not currently require acute inpatient psychiatric care and does not currently meet [facility] involuntary commitment criteria.   Medical History/Surgical History: Past Medical History:  Diagnosis Date  . ADHD   . Anal pain   . Depression   . Depression   . GAD (generalized anxiety disorder)   . Rectal mass   . Wears glasses     Past Surgical History:  Procedure Laterality Date  . EVALUATION UNDER ANESTHESIA WITH FISTULECTOMY N/A 02/09/2016   Procedure: EXAM UNDER ANESTHESIA with EXCISIONAL BIOPSY OF ANAL CANAL MASS;  Surgeon: Romie Levee, MD;  Location: [facility];  Service: General;  Laterality: N/A;  . SIGMOIDOSCOPY  01/2015  . SPHINCTEROTOMY N/A 02/09/2016   Procedure: CHEMICAL SPHINCTEROTOMY;  Surgeon: Romie Levee, MD;   Location: [facility];  Service: General;  Laterality: N/A;    Medications: Current Outpatient Medications  Medication Sig Dispense Refill  . amphetamine-dextroamphetamine (ADDERALL) 30 MG tablet Take one tab total 30 mg every morning and 1/2 tab total 15 mg every 3 PM 45 tablet 0  . amphetamine-dextroamphetamine (ADDERALL) 30 MG tablet Take one tab total 30 mg every morning and 1/2 tab total 15 mg every 3 PM 45 tablet 0  . amphetamine-dextroamphetamine (ADDERALL) 30 MG tablet Take one tab total 30 mg every morning and 1/2 tab total 15 mg every 3 PM 45 tablet 0  . DULoxetine (CYMBALTA) 30 MG capsule Take 1 capsule (30 mg total) by mouth daily after breakfast. 30 capsule 5  . Vitamin D, Ergocalciferol, (DRISDOL) 1.25 MG (50000 UNIT) CAPS capsule Take 1 capsule (50,000 Units total) by mouth every 7 (seven) days for 12 doses. 12 capsule 0   No current facility-administered medications for this visit.   Subjective:   Patient presents recession, sharing progress. He said that he continues to grieve the loss of his father, states he is not been able to focus on work yet. Shared how he spends and considerable amount of time still researching details that led up to his father taking his own life. Three guy discovery, he identified "I know none of this will bring him back". Collaboratively, plan was made for him to accomplish at least one task per day; encouraged him to use this calendar and write these down to increase consistency. Provide support and understanding  throughout. He continues to acknowledge his grief identifying feelings of regret and remorse as well as anger.  reviewed STOPP coping skill to utilize between sessions.  Interventions: CBT, supportive therapy  Diagnoses:    ICD-10-CM   1. Recurrent major depression in partial remission (HCC)  F33.41   2. Attention deficit hyperactivity disorder (ADHD), combined type  F90.2     Plan: Patient is to use CBT, mindfulness and  coping skills to help manage decrease symptoms associated with their diagnosis.  Patient to use his calendar to achieve tasks to increase work functioning.   Long-term goal:   Reduce overall level, frequency, and intensity  of his symptoms up to 80% of the time severity for at least 3 consecutive months.  Patient to continue to abstain from using alcohol.  Patient to journal between sessions.  Patient to continue to utilize his support system and work on his marital relationship through open communication and allowing himself to receive support.   Short-term goal:  Decrease emotionally distressing self talk such as thinking of the worse possible life outcome Allow feelings to be identified and processed related to the loss of his father Decrease ruminating, specifically regarding his communication in his marital relationship Improved communication in his marital relationship utilizing active listening   Assessment of progress:  progressing   Waldron Session, [facility]

## 2019-11-24 ENCOUNTER — Ambulatory Visit (INDEPENDENT_AMBULATORY_CARE_PROVIDER_SITE_OTHER): Payer: 59 | Admitting: Mental Health

## 2019-11-24 ENCOUNTER — Other Ambulatory Visit: Payer: Self-pay

## 2019-11-24 DIAGNOSIS — Z634 Disappearance and death of family member: Secondary | ICD-10-CM

## 2019-11-24 DIAGNOSIS — F3341 Major depressive disorder, recurrent, in partial remission: Secondary | ICD-10-CM | POA: Diagnosis not present

## 2019-11-24 DIAGNOSIS — F902 Attention-deficit hyperactivity disorder, combined type: Secondary | ICD-10-CM | POA: Diagnosis not present

## 2019-11-24 NOTE — Progress Notes (Signed)
[facility]  Name: Nikolus Marczak "[name]" Date: 11/24/19 MRN: [id number] DOB: [birth year] PCP: Philip Aspen, Limmie Patricia, MD  Time spent: 53 minutes  Treatment:   Individual therapy  Mental Status Exam:   Appearance:   Casual     Behavior:  Appropriate  Motor:  Normal  Speech/Language:   Clear and Coherent  Affect:  Full Range  Mood:  anxious, depressed  Thought process:  normal  Thought content:    WNL  Sensory/Perceptual disturbances:    WNL  Orientation:  x4  Attention:  Good  Concentration:  Good  Memory:  WNL  Fund of knowledge:   Good  Insight:    Good  Judgment:   Good  Impulse Control:  Good   Reported Symptoms: Anxiety, distractible, intermittent depressed mood, rumination, negative self talk  Risk Assessment: Danger to Self:  No Self-injurious Behavior: No Danger to Others: No Duty to Warn:no Physical Aggression / Violence:No  Access to Firearms a concern: No  Gang Involvement:No  Patient / guardian was educated about steps to take if suicide or homicide risk level increases between visits: yes While future psychiatric events cannot be accurately predicted, the patient does not currently require acute inpatient psychiatric care and does not currently meet [facility] involuntary commitment criteria.   Medical History/Surgical History: Past Medical History:  Diagnosis Date  . ADHD   . Anal pain   . Depression   . Depression   . GAD (generalized anxiety disorder)   . Rectal mass   . Wears glasses     Past Surgical History:  Procedure Laterality Date  . EVALUATION UNDER ANESTHESIA WITH FISTULECTOMY N/A 02/09/2016   Procedure: EXAM UNDER ANESTHESIA with EXCISIONAL BIOPSY OF ANAL CANAL MASS;  Surgeon: Romie Levee, MD;  Location: [facility];  Service: General;  Laterality: N/A;  . SIGMOIDOSCOPY  01/2015  . SPHINCTEROTOMY N/A 02/09/2016   Procedure: CHEMICAL SPHINCTEROTOMY;  Surgeon: Romie Levee,  MD;  Location: [facility];  Service: General;  Laterality: N/A;    Medications: Current Outpatient Medications  Medication Sig Dispense Refill  . amphetamine-dextroamphetamine (ADDERALL) 30 MG tablet Take one tab total 30 mg every morning and 1/2 tab total 15 mg every 3 PM 45 tablet 0  . amphetamine-dextroamphetamine (ADDERALL) 30 MG tablet Take one tab total 30 mg every morning and 1/2 tab total 15 mg every 3 PM 45 tablet 0  . amphetamine-dextroamphetamine (ADDERALL) 30 MG tablet Take one tab total 30 mg every morning and 1/2 tab total 15 mg every 3 PM 45 tablet 0  . DULoxetine (CYMBALTA) 30 MG capsule Take 1 capsule (30 mg total) by mouth daily after breakfast. 30 capsule 5  . Vitamin D, Ergocalciferol, (DRISDOL) 1.25 MG (50000 UNIT) CAPS capsule Take 1 capsule (50,000 Units total) by mouth every 7 (seven) days for 12 doses. 12 capsule 0   No current facility-administered medications for this visit.   Subjective:   Patient presents for session a few minutes late.  Shared progress stated that he and his wife plan to go for a hike later today.  He stated that he has been continuing to cope with distraction, problems with focus and concentration increasingly since his father's passing.  He stated his coming late to today's session was a result of this.  He stated that his brother currently has COVID and he hopes that he will be okay although he has pneumonia.  He went on to share the relationship strain he has with  his mother, who has not taken the pandemic seriously in his view as he stated that she has a long history of religious preoccupation and conspiracy theory driven ideologies.  Processed feelings of frustration related to the ongoing challenges he has to face in this relationship as his views differ significantly.  Through guided discovery, he identified the need to have his thoughts and feelings validated from his mother which has been ongoing missing component of the  relationship.  Identifying anxiety related to his brother's health currently, coupled with the recent loss of his father has been emotionally distressing to him.  Facilitated his awareness between these thoughts and his subsequent feelings as well as his verbalizing ways to cope and care for himself such as spending time with his wife, going hiking, spending time with his children in various activities.  He stated he has taken steps since last session to make progress with completing daily tasks related to work as progress.  Interventions: CBT, supportive therapy  Diagnoses:    ICD-10-CM   1. Recurrent major depression in partial remission (HCC)  F33.41   2. Attention deficit hyperactivity disorder (ADHD), combined type  F90.2   3. Bereavement  Z63.4     Plan: Patient is to use CBT, mindfulness and coping skills to help manage decrease symptoms associated with their diagnosis.  Patient to continue to use his calendar to achieve tasks to increase work functioning.  Patient to continue to utilize outlets for stress such as spending time outdoors, hiking with family etc.  Long-term goal:   Reduce overall level, frequency, and intensity  of his symptoms up to 80% of the time severity for at least 3 consecutive months.  Patient to continue to abstain from using alcohol.  Patient to journal between sessions.  Patient to continue to utilize his support system and work on his marital relationship through open communication and allowing himself to receive support.   Short-term goal:  Decrease emotionally distressing self talk such as thinking of the worse possible life outcome Allow feelings to be identified and processed related to the loss of his father Decrease ruminating, specifically regarding his communication in his marital relationship Improved communication in his marital relationship utilizing active listening   Assessment of progress:  progressing   Waldron Session, [facility]

## 2019-12-01 ENCOUNTER — Ambulatory Visit (INDEPENDENT_AMBULATORY_CARE_PROVIDER_SITE_OTHER): Payer: 59 | Admitting: Mental Health

## 2019-12-01 ENCOUNTER — Other Ambulatory Visit: Payer: Self-pay

## 2019-12-01 ENCOUNTER — Encounter: Payer: Self-pay | Admitting: Psychiatry

## 2019-12-01 DIAGNOSIS — F3341 Major depressive disorder, recurrent, in partial remission: Secondary | ICD-10-CM

## 2019-12-01 DIAGNOSIS — Z634 Disappearance and death of family member: Secondary | ICD-10-CM

## 2019-12-01 DIAGNOSIS — F902 Attention-deficit hyperactivity disorder, combined type: Secondary | ICD-10-CM | POA: Diagnosis not present

## 2019-12-01 NOTE — Progress Notes (Signed)
[facility]  Name: Lucas Herrera "Lucas Herrera" Date: 12/01/19 MRN: [id number] DOB: Apr 01, 1985 PCP: Philip Aspen, Limmie Patricia, MD  Time spent: 53 minutes  Treatment:   Individual therapy  Mental Status Exam:   Appearance:   Casual     Behavior:  Appropriate  Motor:  Normal  Speech/Language:   Clear and Coherent  Affect:  Full Range  Mood:  anxious, depressed  Thought process:  normal  Thought content:    WNL  Sensory/Perceptual disturbances:    WNL  Orientation:  x4  Attention:  Good  Concentration:  Good  Memory:  WNL  Fund of knowledge:   Good  Insight:    Good  Judgment:   Good  Impulse Control:  Good   Reported Symptoms: Anxiety, distractible, intermittent depressed mood, rumination, negative self talk  Risk Assessment: Danger to Self:  No Self-injurious Behavior: No Danger to Others: No Duty to Warn:no Physical Aggression / Violence:No  Access to Firearms a concern: No  Gang Involvement:No  Patient / guardian was educated about steps to take if suicide or homicide risk level increases between visits: yes While future psychiatric events cannot be accurately predicted, the patient does not currently require acute inpatient psychiatric care and does not currently meet [facility] involuntary commitment criteria.   Medical History/Surgical History: Past Medical History:  Diagnosis Date   ADHD    Anal pain    Depression    Depression    GAD (generalized anxiety disorder)    Rectal mass    Wears glasses     Past Surgical History:  Procedure Laterality Date   EVALUATION UNDER ANESTHESIA WITH FISTULECTOMY N/A 02/09/2016   Procedure: EXAM UNDER ANESTHESIA with EXCISIONAL BIOPSY OF ANAL CANAL MASS;  Surgeon: Romie Levee, MD;  Location: [facility];  Service: General;  Laterality: N/A;   SIGMOIDOSCOPY  01/2015   SPHINCTEROTOMY N/A 02/09/2016   Procedure: CHEMICAL SPHINCTEROTOMY;  Surgeon: Romie Levee,  MD;  Location: [facility];  Service: General;  Laterality: N/A;    Medications: Current Outpatient Medications  Medication Sig Dispense Refill   amphetamine-dextroamphetamine (ADDERALL) 30 MG tablet Take one tab total 30 mg every morning and 1/2 tab total 15 mg every 3 PM 45 tablet 0   amphetamine-dextroamphetamine (ADDERALL) 30 MG tablet Take one tab total 30 mg every morning and 1/2 tab total 15 mg every 3 PM 45 tablet 0   amphetamine-dextroamphetamine (ADDERALL) 30 MG tablet Take one tab total 30 mg every morning and 1/2 tab total 15 mg every 3 PM 45 tablet 0   DULoxetine (CYMBALTA) 30 MG capsule Take 1 capsule (30 mg total) by mouth daily after breakfast. 30 capsule 5   Vitamin D, Ergocalciferol, (DRISDOL) 1.25 MG (50000 UNIT) CAPS capsule Take 1 capsule (50,000 Units total) by mouth every 7 (seven) days for 12 doses. 12 capsule 0   No current facility-administered medications for this visit.   Subjective:   Patient presents for session on time.  He shared progress, how he continues to make through the grief process related to the loss of his father which was about 2 months ago.  He stated he continues to have many questions about his passing.  He stated that he reactivated his phone line and was able to retrieve some information.  He stated this brought some clarity and also added more questions.  He continues to seek to find some closure related to the passing of his father which was the result of a  suicide.  Provide support and understanding as he processed many feelings related to events over the past week and the continued grief process with which he continues to navigate.  Continues to have support from his wife, has found staying active engaging in enjoyable activities such as hiking with her has been helpful.  He stated that he has worked to be more consistent with daily tasks related to work and has improved his functioning in this area.  Through guided discovery, he  identified the need to continue to work and be mindful striking a balance with getting enough rest, attending to work tasks, utilizing the support from his close relationships and keeping his marital relationship intact while also giving himself time to grieve the loss of his father.  He also is working to mindful of thoughts he may have with which give him more emotional distress related to questions he has about events surrounding the passing of his father.   Interventions: CBT, supportive therapy  Diagnoses:    ICD-10-CM   1. Recurrent major depression in partial remission (HCC)  F33.41   2. Attention deficit hyperactivity disorder (ADHD), combined type  F90.2   3. Bereavement  Z63.4     Plan: Patient is to use CBT, mindfulness and coping skills to help manage decrease symptoms associated with their diagnosis.  Patient to continue to use his calendar to achieve tasks to increase work functioning.  Patient to continue to utilize outlets for stress such as spending time outdoors, hiking with family etc.  Long-term goal:   Reduce overall level, frequency, and intensity  of his symptoms up to 80% of the time severity for at least 3 consecutive months.  Patient to continue to abstain from using alcohol.  Patient to journal between sessions.  Patient to continue to utilize his support system and work on his marital relationship through open communication and allowing himself to receive support.   Short-term goal:  Decrease emotionally distressing self talk such as thinking of the worse possible life outcome Allow feelings to be identified and processed related to the loss of his father Decrease ruminating, specifically regarding his communication in his marital relationship Improved communication in his marital relationship utilizing active listening   Assessment of progress:  progressing   Waldron Session, [facility]

## 2019-12-07 ENCOUNTER — Other Ambulatory Visit: Payer: Self-pay

## 2019-12-07 ENCOUNTER — Telehealth: Payer: Self-pay | Admitting: Psychiatry

## 2019-12-07 DIAGNOSIS — F900 Attention-deficit hyperactivity disorder, predominantly inattentive type: Secondary | ICD-10-CM

## 2019-12-07 MED ORDER — AMPHETAMINE-DEXTROAMPHETAMINE 30 MG PO TABS: ORAL_TABLET | ORAL | 0 refills | Status: DC

## 2019-12-07 NOTE — Telephone Encounter (Signed)
Last refill 11/02/19 Pended 1 Rx for Dr. Marlyne Beards to reveiiew

## 2019-12-07 NOTE — Telephone Encounter (Signed)
Patient requesting a refill on Adderall. Call to [facility] on [address] [place]. Coming in tomorrow to see Thayer Ohm and will schedule an appt with Dr. Marlyne Beards at this time.

## 2019-12-07 NOTE — Telephone Encounter (Signed)
[place] registry documents last dispensing 11/02/2019 of 30 day escription from 07/22/2019 due the next sent as #45 of the 30 mg IR tablet taking 1 every morning and one half every 3 PM sent to [facility] [address] with no contraindication in the last year per epic or [place] registry

## 2019-12-08 ENCOUNTER — Other Ambulatory Visit: Payer: Self-pay

## 2019-12-08 ENCOUNTER — Ambulatory Visit (INDEPENDENT_AMBULATORY_CARE_PROVIDER_SITE_OTHER): Payer: 59 | Admitting: Mental Health

## 2019-12-08 DIAGNOSIS — F3341 Major depressive disorder, recurrent, in partial remission: Secondary | ICD-10-CM

## 2019-12-08 NOTE — Progress Notes (Signed)
[facility]  Name: Lucas Herrera "Lucas Herrera" Date: 12/08/19 MRN: [id number] DOB: 08-05-1985 PCP: Lucas Herrera, Lucas Patricia, MD  Time spent: 53 minutes  Treatment:   Individual therapy  Mental Status Exam:   Appearance:   Casual     Behavior:  Appropriate  Motor:  Normal  Speech/Language:   Clear and Coherent  Affect:  Full Range  Mood:  anxious, depressed  Thought process:  normal  Thought content:    WNL  Sensory/Perceptual disturbances:    WNL  Orientation:  x4  Attention:  Good  Concentration:  Good  Memory:  WNL  Fund of knowledge:   Good  Insight:    Good  Judgment:   Good  Impulse Control:  Good   Reported Symptoms: Anxiety, distractible, intermittent depressed mood, rumination, negative self talk  Risk Assessment: Danger to Self:  No Self-injurious Behavior: No Danger to Others: No Duty to Warn:no Physical Aggression / Violence:No  Access to Firearms a concern: No  Gang Involvement:No  Patient / guardian was educated about steps to take if suicide or homicide risk level increases between visits: yes While future psychiatric events cannot be accurately predicted, the patient does not currently require acute inpatient psychiatric care and does not currently meet [facility] involuntary commitment criteria.   Medical History/Surgical History: Past Medical History:  Diagnosis Date  . ADHD   . Anal pain   . Depression   . Depression   . GAD (generalized anxiety disorder)   . Rectal mass   . Wears glasses     Past Surgical History:  Procedure Laterality Date  . EVALUATION UNDER ANESTHESIA WITH FISTULECTOMY N/A 02/09/2016   Procedure: EXAM UNDER ANESTHESIA with EXCISIONAL BIOPSY OF ANAL CANAL MASS;  Surgeon: Romie Levee, MD;  Location: [facility];  Service: General;  Laterality: N/A;  . SIGMOIDOSCOPY  01/2015  . SPHINCTEROTOMY N/A 02/09/2016   Procedure: CHEMICAL SPHINCTEROTOMY;  Surgeon: Romie Levee,  MD;  Location: [facility];  Service: General;  Laterality: N/A;    Medications: Current Outpatient Medications  Medication Sig Dispense Refill  . amphetamine-dextroamphetamine (ADDERALL) 30 MG tablet Take one tab total 30 mg every morning and 1/2 tab total 15 mg every 3 PM 45 tablet 0  . amphetamine-dextroamphetamine (ADDERALL) 30 MG tablet Take one tab total 30 mg every morning and 1/2 tab total 15 mg every 3 PM 45 tablet 0  . amphetamine-dextroamphetamine (ADDERALL) 30 MG tablet Take one tab total 30 mg every morning and 1/2 tab total 15 mg every 3 PM 45 tablet 0  . DULoxetine (CYMBALTA) 30 MG capsule Take 1 capsule (30 mg total) by mouth daily after breakfast. 30 capsule 5  . Vitamin D, Ergocalciferol, (DRISDOL) 1.25 MG (50000 UNIT) CAPS capsule Take 1 capsule (50,000 Units total) by mouth every 7 (seven) days for 12 doses. 12 capsule 0   No current facility-administered medications for this visit.   Subjective:   Patient presents for session on time.  He shared progress, stated he is getting enough sleep but feels more tired often.  He stated that he is symptomatic from his researching events surrounding his father's death, feel that this is a needed boundary for himself and shared his rationale regarding others aspects of what he has learned through that process.  He no longer feels that his father's ex-wife was involved in any way in his passing.  Some details were shared related to what is come to understand about his father.  It  will appears that he is allowed himself to move through the grief process, he acknowledges his father's long history of depression which she feels played a role in his suicide.  Assess relationship he has with his other family members, his wife.  He stated they are doing well, referring to his marital relationship how she is supportive and some ways to communicate when he needs downtime, or is engaged and focused on certain activities where he does not  come off as irritable towards his wife was explored collaboratively.  Patient plans to follow through with some of the communication skills discussed today.  Ways to continue to cope and care for himself for explored and reviewed.  Getting outside at least once per day to walk the dog and go for hikes when possible or some of his main outlets.   Interventions: CBT, supportive therapy  Diagnoses:    ICD-10-CM   1. Recurrent major depression in partial remission (HCC)  F33.41     Plan: Patient is to use CBT, mindfulness and coping skills to help manage decrease symptoms associated with their diagnosis.  Patient to continue to use his calendar to achieve tasks to increase work functioning.  Patient to continue to utilize outlets for stress such as spending time outdoors, hiking with family etc.  Long-term goal:   Reduce overall level, frequency, and intensity  of his symptoms up to 80% of the time severity for at least 3 consecutive months.  Patient to continue to abstain from using alcohol.  Patient to journal between sessions.  Patient to continue to utilize his support system and work on his marital relationship through open communication and allowing himself to receive support.   Short-term goal:  Decrease emotionally distressing self talk such as thinking of the worse possible life outcome Allow feelings to be identified and processed related to the loss of his father Decrease ruminating, specifically regarding his communication in his marital relationship Improved communication in his marital relationship utilizing active listening   Assessment of progress:  progressing   Waldron Session, [facility]

## 2019-12-16 ENCOUNTER — Ambulatory Visit (INDEPENDENT_AMBULATORY_CARE_PROVIDER_SITE_OTHER): Payer: 59 | Admitting: Mental Health

## 2019-12-16 ENCOUNTER — Other Ambulatory Visit: Payer: Self-pay

## 2019-12-16 DIAGNOSIS — F902 Attention-deficit hyperactivity disorder, combined type: Secondary | ICD-10-CM

## 2019-12-16 DIAGNOSIS — F3341 Major depressive disorder, recurrent, in partial remission: Secondary | ICD-10-CM | POA: Diagnosis not present

## 2019-12-16 DIAGNOSIS — Z634 Disappearance and death of family member: Secondary | ICD-10-CM | POA: Diagnosis not present

## 2019-12-16 NOTE — Progress Notes (Signed)
[facility]  Name: Lucas Herrera "[name]" Date: 12/15/19 MRN: [id number] DOB: Nov 24, 1985 PCP: Philip Aspen, Limmie Patricia, MD  Time spent: 50 minutes  Treatment:   Individual therapy  Mental Status Exam:   Appearance:   Casual     Behavior:  Appropriate  Motor:  Normal  Speech/Language:   Clear and Coherent  Affect:  Full Range  Mood:  anxious, depressed  Thought process:  normal  Thought content:    WNL  Sensory/Perceptual disturbances:    WNL  Orientation:  x4  Attention:  Good  Concentration:  Good  Memory:  WNL  Fund of knowledge:   Good  Insight:    Good  Judgment:   Good  Impulse Control:  Good   Reported Symptoms: Anxiety, distractible, intermittent depressed mood, rumination, negative self talk  Risk Assessment: Danger to Self:  No Self-injurious Behavior: No Danger to Others: No Duty to Warn:no Physical Aggression / Violence:No  Access to Firearms a concern: No  Gang Involvement:No  Patient / guardian was educated about steps to take if suicide or homicide risk level increases between visits: yes While future psychiatric events cannot be accurately predicted, the patient does not currently require acute inpatient psychiatric care and does not currently meet [facility] involuntary commitment criteria.   Medical History/Surgical History: Past Medical History:  Diagnosis Date   ADHD    Anal pain    Depression    Depression    GAD (generalized anxiety disorder)    Rectal mass    Wears glasses     Past Surgical History:  Procedure Laterality Date   EVALUATION UNDER ANESTHESIA WITH FISTULECTOMY N/A 02/09/2016   Procedure: EXAM UNDER ANESTHESIA with EXCISIONAL BIOPSY OF ANAL CANAL MASS;  Surgeon: Romie Levee, MD;  Location: [facility];  Service: General;  Laterality: N/A;   SIGMOIDOSCOPY  01/2015   SPHINCTEROTOMY N/A 02/09/2016   Procedure: CHEMICAL SPHINCTEROTOMY;  Surgeon: Romie Levee,  MD;  Location: [facility];  Service: General;  Laterality: N/A;    Medications: Current Outpatient Medications  Medication Sig Dispense Refill   amphetamine-dextroamphetamine (ADDERALL) 30 MG tablet Take one tab total 30 mg every morning and 1/2 tab total 15 mg every 3 PM 45 tablet 0   amphetamine-dextroamphetamine (ADDERALL) 30 MG tablet Take one tab total 30 mg every morning and 1/2 tab total 15 mg every 3 PM 45 tablet 0   amphetamine-dextroamphetamine (ADDERALL) 30 MG tablet Take one tab total 30 mg every morning and 1/2 tab total 15 mg every 3 PM 45 tablet 0   DULoxetine (CYMBALTA) 30 MG capsule Take 1 capsule (30 mg total) by mouth daily after breakfast. 30 capsule 5   Vitamin D, Ergocalciferol, (DRISDOL) 1.25 MG (50000 UNIT) CAPS capsule Take 1 capsule (50,000 Units total) by mouth every 7 (seven) days for 12 doses. 12 capsule 0   No current facility-administered medications for this visit.   Subjective:   Patient presents for session.  Shared progress, how he feels he continues to live on from his father's passing, spending less time focusing on event surrounding his passing, now allowing himself to feel the emotions of the loss.  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  His marital relationship is going well as he shared some details as well as experiences with family as they have gone on some recent hiking trips.  Facilitated his continuing to identify and processed  feelings related to the loss of his father, as well as given information he is now aware of about his father's life.  He identified ways he is need to set some boundaries with some relationships.  Facilitated his identifying thoughts related to the grief process as well as ways to cope and care for himself.  He plans to continue to be more mindful of his sleep schedule, getting enough rest eating sufficiently and  avoiding alcohol use as a coping tool.   Interventions: CBT, supportive therapy  Diagnoses:    ICD-10-CM   1. Recurrent major depression in partial remission (HCC)  F33.41   2. Attention deficit hyperactivity disorder (ADHD), combined type  F90.2   3. Bereavement  Z63.4     Plan: Patient is to use CBT, mindfulness and coping skills to help manage decrease symptoms associated with their diagnosis.  Patient to continue to use his calendar to achieve tasks to increase work functioning.  Patient to continue to utilize outlets for stress such as spending time outdoors, hiking with family etc.  Long-term goal:   Reduce overall level, frequency, and intensity  of his symptoms up to 80% of the time severity for at least 3 consecutive months.  Patient to continue to abstain from using alcohol.  Patient to journal between sessions.  Patient to continue to utilize his support system and work on his marital relationship through open communication and allowing himself to receive support.   Short-term goal:  Decrease emotionally distressing self talk such as thinking of the worse possible life outcome Allow feelings to be identified and processed related to the loss of his father Decrease ruminating, specifically regarding his communication in his marital relationship Improved communication in his marital relationship utilizing active listening   Assessment of progress:  progressing   Waldron Session, [facility]

## 2019-12-23 ENCOUNTER — Ambulatory Visit: Payer: 59 | Admitting: Mental Health

## 2019-12-29 ENCOUNTER — Ambulatory Visit: Payer: 59 | Admitting: Mental Health

## 2020-01-13 ENCOUNTER — Telehealth: Payer: Self-pay | Admitting: Psychiatry

## 2020-01-13 DIAGNOSIS — F3341 Major depressive disorder, recurrent, in partial remission: Secondary | ICD-10-CM

## 2020-01-13 DIAGNOSIS — F900 Attention-deficit hyperactivity disorder, predominantly inattentive type: Secondary | ICD-10-CM

## 2020-01-13 DIAGNOSIS — F4010 Social phobia, unspecified: Secondary | ICD-10-CM

## 2020-01-13 MED ORDER — AMPHETAMINE-DEXTROAMPHETAMINE 30 MG PO TABS: ORAL_TABLET | ORAL | 0 refills | Status: DC

## 2020-01-13 NOTE — Telephone Encounter (Signed)
Adderall 30 mg IR tablet taking 1 every morning and one half every 2 PM is E scribed #45 to [facility] [address] per [place] registry and epic including for the last year with last dispensing October 26 for interim to next appointment 01/18/2020 medically necessary.

## 2020-01-13 NOTE — Telephone Encounter (Signed)
Pt called and needs a refill on his adderall 30 mg to be sent to the [facility] [address]. He has an appt on 01/18/20

## 2020-01-18 ENCOUNTER — Encounter: Payer: Self-pay | Admitting: Psychiatry

## 2020-01-18 ENCOUNTER — Ambulatory Visit (INDEPENDENT_AMBULATORY_CARE_PROVIDER_SITE_OTHER): Payer: 59 | Admitting: Psychiatry

## 2020-01-18 ENCOUNTER — Ambulatory Visit (INDEPENDENT_AMBULATORY_CARE_PROVIDER_SITE_OTHER): Payer: 59 | Admitting: Mental Health

## 2020-01-18 ENCOUNTER — Other Ambulatory Visit: Payer: Self-pay

## 2020-01-18 VITALS — Ht 75.0 in | Wt 193.0 lb

## 2020-01-18 DIAGNOSIS — F331 Major depressive disorder, recurrent, moderate: Secondary | ICD-10-CM

## 2020-01-18 DIAGNOSIS — F4010 Social phobia, unspecified: Secondary | ICD-10-CM

## 2020-01-18 DIAGNOSIS — F900 Attention-deficit hyperactivity disorder, predominantly inattentive type: Secondary | ICD-10-CM

## 2020-01-18 DIAGNOSIS — F3341 Major depressive disorder, recurrent, in partial remission: Secondary | ICD-10-CM

## 2020-01-18 MED ORDER — AMPHETAMINE-DEXTROAMPHETAMINE 30 MG PO TABS: ORAL_TABLET | ORAL | 0 refills | Status: DC

## 2020-01-18 MED ORDER — DULOXETINE HCL 60 MG PO CPEP: 60.0000 mg | ORAL_CAPSULE | Freq: Every day | ORAL | 3 refills | Status: DC

## 2020-01-18 NOTE — Progress Notes (Signed)
[facility]  Patient ID: Lucas Herrera,  MRN: [id number]  PCP: Philip Aspen, Limmie Patricia, MD  Date of Evaluation: 01/18/2020 Time spent:20 minutes from 0920 to 0940  Chief Complaint:  Chief Complaint    Depression; Anxiety; ADHD      HISTORY/CURRENT STATUS: Lucas Herrera is seen onsite in office 20 minutes face-to-face individually with consent with epic collateral for psychiatric interview and exam ain 7-month evaluation and management of major depression, social anxiety, ADHD, and more family stressors. At this 4th appointment with me since 2 appointments with Anne Fu, PA shortly before Clay's death and in previous care at [facility] and possibly [place] counseling about which he is still conflicted, treatment included Adderall, Paxil, Wellbutrin, Zoloft,Trintellix, and Viibryd. SSRI medications caused sexual dysfunction as have others, Trintellix had significant side effects after several months, and he stopped Viibryd 3 weeks before last appointment for cost over $250 monthly as his wife's insurance changed.  He now has 6 months on Cymbalta 30 mg daily noticing more anxiety on the low-dose without side effects but now the last couple of months depression has been increasing much more.  Father completed suicide at the end of August, patient increasing therapy here with [name] to twice monthly but still finding depression getting worse in the last month.  [place] registry documents last Addrall dispensing 12/07/2019.  The patient's blended family household is otherwise doing well as is his work.  He is not suicidal, though he considers father's suicide depressing and anxiety provoking as may be the season.  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  He has no mania, suicidality, psychosis or delirium.   Depression This is a recurrentproblem.Themost recentepisode started 3 months ago. The onset quality is  sudden. The problem occurs intermittently.The problem has been waxing and waning.Associated symptoms include social avoidance and oversensitivity, worried inhibition, decreased concentration, hopelessness, decreased interest,and mournful sadness. Associated symptoms includeno insomnia,no headaches, noappetite change, noindigestion,and no suicidal ideas.The symptoms are aggravated by social issues, family issues and work stress.Past treatments include SSRIs - Selective serotonin reuptake inhibitors, other medications and psychotherapy.Compliance with treatment is variable.Past compliance problems include difficulty with treatment plan and medication issues.Previous treatment provided moderaterelief.Risk factors include a change in medication usage/dosage.  Individual Medical History/ Review of Systems: Changes? :Yes weight is down 5 pounds in 6 months.  Allergies: Patient has no known allergies.  Current Medications:  Current Outpatient Medications:  .  amphetamine-dextroamphetamine (ADDERALL) 30 MG tablet, Take one tab total 30 mg every morning and 1/2 tab total 15 mg every 3 PM, Disp: 45 tablet, Rfl: 0 .  [START ON 02/17/2020] amphetamine-dextroamphetamine (ADDERALL) 30 MG tablet, Take one tab total 30 mg every morning and 1/2 tab total 15 mg every 3 PM, Disp: 45 tablet, Rfl: 0 .  [START ON 03/18/2020] amphetamine-dextroamphetamine (ADDERALL) 30 MG tablet, Take one tab total 30 mg every morning and 1/2 tab total 15 mg every 3 PM, Disp: 45 tablet, Rfl: 0 .  DULoxetine (CYMBALTA) 60 MG capsule, Take 1 capsule (60 mg total) by mouth daily after breakfast., Disp: 30 capsule, Rfl: 3  Medication Side Effects: none  Family Medical/ Social History: Changes? Yes mother completing suicide in late August and mother with depression. Otherwise there is history of MI and possibly dysrhythmias  MENTAL HEALTH EXAM:  Height 6\' 3"  (1.905 m), weight 193 lb (87.5 kg).Body mass index is 24.12  kg/m. Muscle strengths and tone 5/5, postural reflexes and gait 0/0, and AIMS =  0.  General Appearance: Casual, Meticulous and Well Groomed  Eye Contact:  Good  Speech:  Clear and Coherent, Normal Rate and Talkative  Volume:  Normal  Mood:  Anxious, Depressed, Dysphoric and Hopeless  Affect:  Congruent, Depressed, Inappropriate, Restricted and Anxious  Thought Process:  Coherent, Goal Directed, Irrelevant, Linear and Descriptions of Associations: Circumstantial  Orientation:  Full (Time, Place, and Person)  Thought Content: Obsessions and Rumination   Suicidal Thoughts:  No  Homicidal Thoughts:  No  Memory:  Immediate;   Good Remote;   Good  Judgement:  Good  Insight:  Fair  Psychomotor Activity:  Normal, Mannerisms and Psychomotor Retardation  Concentration:  Concentration: Fair and Attention Span: Fair  Recall:  [employer] of Knowledge: Good  Language: Good  Assets:  Desire for Improvement Resilience Talents/Skills Vocational/Educational  ADL's:  Intact  Cognition: WNL  Prognosis:  Fair    DIAGNOSES:    ICD-10-CM   1. Moderate recurrent major depression (HCC)  F33.1 DULoxetine (CYMBALTA) 60 MG capsule  2. Social anxiety disorder  F40.10 DULoxetine (CYMBALTA) 60 MG capsule  3. Attention deficit hyperactivity disorder (ADHD), inattentive type, moderate  F90.0 amphetamine-dextroamphetamine (ADDERALL) 30 MG tablet    amphetamine-dextroamphetamine (ADDERALL) 30 MG tablet    amphetamine-dextroamphetamine (ADDERALL) 30 MG tablet    Receiving Psychotherapy: Yes  with [name]   RECOMMENDATIONS: Grief and loss support and education along with symptom treatment matching for medication concludes to increase Cymbalta to the full dosing level while continuing Adderall last dispensed 10/26.  Cymbalta is E scribed 60 mg every morning sent as #30 with 3 refills to [facility] [address] for major depression and social anxiety.  Adderall is E scribed 30 mg IR tablet taking 1  every morning after breakfast and one-half total 15 mg every 3 PM sent as #45 each for December 7, January 6, and February 5 of ADHD to [facility] [address].  Case closure for my care reassures update for prevention and monitoring safety hygiene especially for crisis plans if needed regarding father's suicide as patient will transition transfer to [facility], DNP in follow up in 3 months.   Chauncey Mann, MD
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
## 2020-01-20 ENCOUNTER — Ambulatory Visit: Payer: 59 | Admitting: Psychiatry

## 2020-01-28 ENCOUNTER — Ambulatory Visit: Payer: 59 | Admitting: Mental Health

## 2020-04-17 ENCOUNTER — Encounter: Payer: Self-pay | Admitting: Adult Health

## 2020-04-17 ENCOUNTER — Other Ambulatory Visit: Payer: Self-pay

## 2020-04-17 ENCOUNTER — Ambulatory Visit (INDEPENDENT_AMBULATORY_CARE_PROVIDER_SITE_OTHER): Payer: 59 | Admitting: Adult Health

## 2020-04-17 DIAGNOSIS — F331 Major depressive disorder, recurrent, moderate: Secondary | ICD-10-CM | POA: Diagnosis not present

## 2020-04-17 DIAGNOSIS — F4010 Social phobia, unspecified: Secondary | ICD-10-CM

## 2020-04-17 DIAGNOSIS — Z634 Disappearance and death of family member: Secondary | ICD-10-CM | POA: Diagnosis not present

## 2020-04-17 DIAGNOSIS — F900 Attention-deficit hyperactivity disorder, predominantly inattentive type: Secondary | ICD-10-CM

## 2020-04-17 MED ORDER — AMPHETAMINE-DEXTROAMPHETAMINE 30 MG PO TABS: ORAL_TABLET | ORAL | 0 refills | Status: DC

## 2020-04-17 MED ORDER — AMPHETAMINE-DEXTROAMPHETAMINE 30 MG PO TABS
ORAL_TABLET | ORAL | 0 refills | Status: DC
Start: 2020-04-17 — End: 2020-07-03

## 2020-04-17 MED ORDER — DULOXETINE HCL 60 MG PO CPEP: 60.0000 mg | ORAL_CAPSULE | Freq: Every day | ORAL | 3 refills | Status: DC

## 2020-04-17 NOTE — Progress Notes (Signed)
Lucas Herrera [id number] 06-Mar-1985 35 y.o.  Subjective:   Patient ID:  Lucas Herrera is a 35 y.o. (DOB 1986/01/14) male.  Chief Complaint: No chief complaint on file.   HPI Lucas Herrera presents to the office today for follow-up of   Describes mood today as "ok". Pleasant. Tearful at times. Mood symptoms - reports depression, anxiety, and irritability. Stating "I have a lot of overwhelming feelings". Lost father to suicide in August. Having "unfamiliar" feelings he's trying to navigate through. Plans to restart therapy with [name]. Stable interest and motivation. Taking medications as prescribed.  Energy levels Active, does not have a regular exercise routine.  Enjoys some usual interests and activities. Married. Lives with wife and 2 children - 8 and 7. Spending time with family. Appetite adequate. Weight stable - 205 pounds. Sleeps well most nights. Averages 6 to 8 hours. Focus and concentration stable. Completing tasks. 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  Denies AH or VH.  Previous medication trials: Unknown   Secondary school teacher Row Office Visit from 10/21/2019 in [facility] Total Score 3  PHQ-9 Total Score 12       Review of Systems:  Review of Systems  Musculoskeletal: Negative for gait problem.  Neurological: Negative for tremors.  Psychiatric/Behavioral:       Please refer to HPI    Medications: I have reviewed the patient's current medications.  Current Outpatient Medications  Medication Sig Dispense Refill  . amphetamine-dextroamphetamine (ADDERALL) 30 MG tablet Take one tab total 30 mg every morning and 1/2 tab total 15 mg every 3 PM 45 tablet 0  . [START ON 05/15/2020] amphetamine-dextroamphetamine (ADDERALL) 30 MG tablet Take one tab total 30 mg every morning and 1/2 tab total 15 mg every 3 PM 45 tablet 0  . [START ON 06/12/2020] amphetamine-dextroamphetamine (ADDERALL)  30 MG tablet Take one tab total 30 mg every morning and 1/2 tab total 15 mg every 3 PM 45 tablet 0  . DULoxetine (CYMBALTA) 60 MG capsule Take 1 capsule (60 mg total) by mouth daily after breakfast. 30 capsule 3   No current facility-administered medications for this visit.    Medication Side Effects: None  Allergies: No Known Allergies  Past Medical History:  Diagnosis Date  . ADHD   . Anal pain   . Depression   . Depression   . GAD (generalized anxiety disorder)   . Rectal mass   . Wears glasses     Family History  Problem Relation Age of Onset  . CAD Mother   . Suicidality Father     Social History   Socioeconomic History  . Marital status: Married    Spouse name: Not on file  . Number of children: Not on file  . Years of education: Not on file  . Highest education level: Not on file  Occupational History  . Not on file  Tobacco Use  . Smoking status: Never Smoker  . Smokeless tobacco: Never Used  Vaping Use  . Vaping Use: Never used  Substance and Sexual Activity  . Alcohol use: Yes    Alcohol/week: 3.0 standard drinks    Types: 3 Cans of beer per week    Comment: occasional  . Drug use: Yes    Types: Marijuana    Comment: 2-3 x a day  . Sexual activity: Not on file  Other Topics Concern  . Not on file  Social History  Narrative  . Not on file   Social Determinants of Health   Financial Resource Strain: Not on file  Food Insecurity: Not on file  Transportation Needs: Not on file  Physical Activity: Not on file  Stress: Not on file  Social Connections: Not on file  Intimate Partner Violence: Not on file    Past Medical History, Surgical history, Social history, and Family history were reviewed and updated as appropriate.   Please see review of systems for further details on the patient's review from today.   Objective:   Physical Exam:  There were no vitals taken for this visit.  Physical Exam Constitutional:      General: He is not in  acute distress. Musculoskeletal:        General: No deformity.  Neurological:     Mental Status: He is alert and oriented to person, place, and time.     Coordination: Coordination normal.  Psychiatric:        Attention and Perception: Attention and perception normal. He does not perceive auditory or visual hallucinations.        Mood and Affect: Mood normal. Mood is not anxious or depressed. Affect is not labile, blunt, angry or inappropriate.        Speech: Speech normal.        Behavior: Behavior normal.        Thought Content: Thought content normal. Thought content is not paranoid or delusional. Thought content does not include homicidal or suicidal ideation. Thought content does not include homicidal or suicidal plan.        Cognition and Memory: Cognition and memory normal.        Judgment: Judgment normal.     Comments: Insight intact     Lab Review:     Component Value Date/Time   NA 140 10/21/2019 0854   K 4.3 10/21/2019 0854   CL 102 10/21/2019 0854   CO2 31 10/21/2019 0854   GLUCOSE 86 10/21/2019 0854   BUN 10 10/21/2019 0854   CREATININE 0.76 10/21/2019 0854   CALCIUM 9.5 10/21/2019 0854   PROT 6.9 10/21/2019 0854   AST 22 10/21/2019 0854   ALT 16 10/21/2019 0854   BILITOT 0.6 10/21/2019 0854       Component Value Date/Time   WBC 5.9 10/21/2019 0854   RBC 4.88 10/21/2019 0854   HGB 15.7 10/21/2019 0854   HCT 46.1 10/21/2019 0854   PLT 258 10/21/2019 0854   MCV 94.5 10/21/2019 0854   MCH 32.2 10/21/2019 0854   MCHC 34.1 10/21/2019 0854   RDW 11.8 10/21/2019 0854   LYMPHSABS 2,018 10/21/2019 0854   EOSABS 18 10/21/2019 0854   BASOSABS 18 10/21/2019 0854    No results found for: POCLITH, LITHIUM   No results found for: PHENYTOIN, PHENOBARB, VALPROATE, CBMZ   .res Assessment: Plan:    Plan:  PDMP reviewed  1. Cymbalta 60mg  daily  2. Adderall 30mg  - take one and 1/2 tablets  120/85 89  Read and reviewed note with patient for accuracy.   RTC  3 months  Patient advised to contact office with any questions, adverse effects, or acute worsening in signs and symptoms.  Discussed potential benefits, risks, and side effects of stimulants with patient to include increased heart rate, palpitations, insomnia, increased anxiety, increased irritability, or decreased appetite.  Instructed patient to contact office if experiencing any significant tolerability issues.  Diagnoses and all orders for this visit:  Bereavement  Social anxiety disorder -  DULoxetine (CYMBALTA) 60 MG capsule; Take 1 capsule (60 mg total) by mouth daily after breakfast.  Moderate recurrent major depression (HCC) -     DULoxetine (CYMBALTA) 60 MG capsule; Take 1 capsule (60 mg total) by mouth daily after breakfast.  Attention deficit hyperactivity disorder (ADHD), inattentive type, moderate -     amphetamine-dextroamphetamine (ADDERALL) 30 MG tablet; Take one tab total 30 mg every morning and 1/2 tab total 15 mg every 3 PM -     amphetamine-dextroamphetamine (ADDERALL) 30 MG tablet; Take one tab total 30 mg every morning and 1/2 tab total 15 mg every 3 PM -     amphetamine-dextroamphetamine (ADDERALL) 30 MG tablet; Take one tab total 30 mg every morning and 1/2 tab total 15 mg every 3 PM     Please see After Visit Summary for patient specific instructions.  Future Appointments  Date Time Provider Department Center  04/19/2020  8:00 AM Philip Aspen, Limmie Patricia, MD [id number]    No orders of the defined types were placed in this encounter.   -------------------------------

## 2020-04-18 ENCOUNTER — Other Ambulatory Visit: Payer: Self-pay

## 2020-04-19 ENCOUNTER — Ambulatory Visit (INDEPENDENT_AMBULATORY_CARE_PROVIDER_SITE_OTHER): Payer: 59 | Admitting: Internal Medicine

## 2020-04-19 ENCOUNTER — Encounter: Payer: Self-pay | Admitting: Internal Medicine

## 2020-04-19 ENCOUNTER — Other Ambulatory Visit (HOSPITAL_COMMUNITY)
Admission: RE | Admit: 2020-04-19 | Discharge: 2020-04-19 | Disposition: A | Discharge status: Home / Self Care | Payer: 59 | Source: Ambulatory Visit | Attending: Internal Medicine | Admitting: Internal Medicine

## 2020-04-19 VITALS — BP 120/80 | HR 88 | Temp 98.3°F | Wt 197.3 lb

## 2020-04-19 DIAGNOSIS — Z113 Encounter for screening for infections with a predominantly sexual mode of transmission: Secondary | ICD-10-CM | POA: Diagnosis present

## 2020-04-19 DIAGNOSIS — F411 Generalized anxiety disorder: Secondary | ICD-10-CM

## 2020-04-19 DIAGNOSIS — E559 Vitamin D deficiency, unspecified: Secondary | ICD-10-CM

## 2020-04-19 DIAGNOSIS — Z1283 Encounter for screening for malignant neoplasm of skin: Secondary | ICD-10-CM

## 2020-04-19 DIAGNOSIS — F32A Depression, unspecified: Secondary | ICD-10-CM | POA: Diagnosis not present

## 2020-04-19 DIAGNOSIS — F909 Attention-deficit hyperactivity disorder, unspecified type: Secondary | ICD-10-CM | POA: Diagnosis not present

## 2020-04-19 DIAGNOSIS — K644 Residual hemorrhoidal skin tags: Secondary | ICD-10-CM

## 2020-04-19 NOTE — Patient Instructions (Signed)
-  Nice seeing you today!!  -Lab work today; will notify you once results are available.  -Schedule follow up in 1 year or sooner as needed. 

## 2020-04-19 NOTE — Progress Notes (Signed)
Established Patient Office Visit     This visit occurred during the SARS-CoV-2 public health emergency.  Safety protocols were in place, including screening questions prior to the visit, additional usage of staff PPE, and extensive cleaning of exam room while observing appropriate contact time as indicated for disinfecting solutions.    CC/Reason for Visit: Follow-up chronic conditions, discuss acute concerns  HPI: Lucas Herrera is a 35 y.o. male who is coming in today for the above mentioned reasons. Past Medical History is significant for: Depression/anxiety, ADHD all followed by psychiatry on Adderall and Cymbalta.  He also has a history of vitamin D deficiency and is due to have levels rechecked today.  He is requesting STD screening.  He is also requesting dermatology referral for skin cancer screening as well as general surgery referral for an anal skin tag that he had removed by Dr. Maisie Fus a while back but is still causing him issues.   Past Medical/Surgical History: Past Medical History:  Diagnosis Date  . ADHD   . Anal pain   . Depression   . Depression   . GAD (generalized anxiety disorder)   . Rectal mass   . Wears glasses     Past Surgical History:  Procedure Laterality Date  . EVALUATION UNDER ANESTHESIA WITH FISTULECTOMY N/A 02/09/2016   Procedure: EXAM UNDER ANESTHESIA with EXCISIONAL BIOPSY OF ANAL CANAL MASS;  Surgeon: Romie Levee, MD;  Location: [facility];  Service: General;  Laterality: N/A;  . SIGMOIDOSCOPY  01/2015  . SPHINCTEROTOMY N/A 02/09/2016   Procedure: CHEMICAL SPHINCTEROTOMY;  Surgeon: Romie Levee, MD;  Location: [facility];  Service: General;  Laterality: N/A;    Social History:  reports that he has never smoked. He has never used smokeless tobacco. He reports current alcohol use of about 3.0 standard drinks of alcohol per week. He reports current drug use. Drug: Marijuana.  Allergies: No Known  Allergies  Family History:  Family History  Problem Relation Age of Onset  . CAD Mother   . Suicidality Father      Current Outpatient Medications:  .  amphetamine-dextroamphetamine (ADDERALL) 30 MG tablet, Take one tab total 30 mg every morning and 1/2 tab total 15 mg every 3 PM, Disp: 45 tablet, Rfl: 0 .  [START ON 05/15/2020] amphetamine-dextroamphetamine (ADDERALL) 30 MG tablet, Take one tab total 30 mg every morning and 1/2 tab total 15 mg every 3 PM, Disp: 45 tablet, Rfl: 0 .  [START ON 06/12/2020] amphetamine-dextroamphetamine (ADDERALL) 30 MG tablet, Take one tab total 30 mg every morning and 1/2 tab total 15 mg every 3 PM, Disp: 45 tablet, Rfl: 0 .  DULoxetine (CYMBALTA) 60 MG capsule, Take 1 capsule (60 mg total) by mouth daily after breakfast., Disp: 30 capsule, Rfl: 3  Review of Systems:  Constitutional: Denies fever, chills, diaphoresis, appetite change and fatigue.  HEENT: Denies photophobia, eye pain, redness, hearing loss, ear pain, congestion, sore throat, rhinorrhea, sneezing, mouth sores, trouble swallowing, neck pain, neck stiffness and tinnitus.   Respiratory: Denies SOB, DOE, cough, chest tightness,  and wheezing.   Cardiovascular: Denies chest pain, palpitations and leg swelling.  Gastrointestinal: Denies nausea, vomiting, abdominal pain, diarrhea, constipation, blood in stool and abdominal distention.  Genitourinary: Denies dysuria, urgency, frequency, hematuria, flank pain and difficulty urinating.  Endocrine: Denies: hot or cold intolerance, sweats, changes in hair or nails, polyuria, polydipsia. Musculoskeletal: Denies myalgias, back pain, joint swelling, arthralgias and gait problem.  Skin: Denies pallor,  rash and wound.  Neurological: Denies dizziness, seizures, syncope, weakness, light-headedness, numbness and headaches.  Hematological: Denies adenopathy. Easy bruising, personal or family bleeding history  Psychiatric/Behavioral: Denies suicidal ideation, mood  changes, confusion, nervousness, sleep disturbance and agitation    Physical Exam: Vitals:   04/19/20 0800  BP: 120/80  Pulse: 88  Temp: 98.3 F (36.8 C)  TempSrc: Oral  SpO2: 99%  Weight: 197 lb 4.8 oz (89.5 kg)    Body mass index is 24.66 kg/m.  Constitutional: NAD, calm, comfortable Eyes: PERRL, lids and conjunctivae normal, wears corrective lenses ENMT: Mucous membranes are moist.  Respiratory: clear to auscultation bilaterally, no wheezing, no crackles. Normal respiratory effort. No accessory muscle use.  Cardiovascular: Regular rate and rhythm, no murmurs / rubs / gallops. No extremity edema.  Neurologic: Grossly intact and nonfocal Psychiatric: Normal judgment and insight. Alert and oriented x 3. Normal mood.    Impression and Plan:  Vitamin D deficiency -Check vitamin D levels today.  Attention deficit hyperactivity disorder (ADHD), unspecified ADHD type GAD (generalized anxiety disorder) Depression, unspecified depression type  PHQ9 SCORE ONLY 04/19/2020 10/21/2019  PHQ-9 Total Score 15 12   -Followed by psychiatry.  Screen for STD (sexually transmitted disease)  - Plan: HIV antibody (with reflex), RPR, Hepatitis panel, acute, also check urine for GC and chlamydia, trichomonas. -Safe sexual practices have been discussed.  Anal skin tag -Referral to general surgery per patient request.  Skin cancer screening -Referral to dermatology.   Patient Instructions  -Nice seeing you today!!  -Lab work today; will notify you once results are available.  -Schedule follow up in 1 year or sooner as needed.     Chaya Jan, MD [facility]

## 2020-04-20 ENCOUNTER — Telehealth: Payer: Self-pay | Admitting: Mental Health

## 2020-04-20 LAB — URINE CYTOLOGY ANCILLARY ONLY
Candida Urine: NEGATIVE
Chlamydia: NEGATIVE
Comment: NEGATIVE
Comment: NEGATIVE
Comment: NORMAL
Neisseria Gonorrhea: NEGATIVE
Trichomonas: NEGATIVE

## 2020-04-20 LAB — HEPATITIS PANEL, ACUTE
Hep A IgM: NONREACTIVE
Hep B C IgM: NONREACTIVE
Hepatitis B Surface Ag: NONREACTIVE
Hepatitis C Ab: NONREACTIVE
SIGNAL TO CUT-OFF: 0.01 (ref <1.00)

## 2020-04-20 LAB — HIV ANTIBODY (ROUTINE TESTING W REFLEX): HIV 1&2 Ab, 4th Generation: NONREACTIVE

## 2020-04-20 LAB — RPR: RPR Ser Ql: NONREACTIVE

## 2020-04-20 NOTE — Telephone Encounter (Signed)
Pt called stating he needs to talk with you for a minute. Needing a letter before end of the month. Next apt 3/28. Contact @ [phone]

## 2020-05-08 ENCOUNTER — Other Ambulatory Visit: Payer: Self-pay

## 2020-05-08 ENCOUNTER — Ambulatory Visit (INDEPENDENT_AMBULATORY_CARE_PROVIDER_SITE_OTHER): Payer: 59 | Admitting: Mental Health

## 2020-05-08 DIAGNOSIS — F4010 Social phobia, unspecified: Secondary | ICD-10-CM | POA: Diagnosis not present

## 2020-05-08 NOTE — Progress Notes (Signed)
[facility]  Name: Lucas Herrera "Lucas Herrera" Date: 05/08/20 MRN: [id number] DOB: 09/25/1985 PCP: Philip Aspen, Limmie Patricia, MD  Time spent: 50 minutes  Treatment:   Individual therapy  Mental Status Exam:   Appearance:   Casual     Behavior:  Appropriate  Motor:  Normal  Speech/Language:   Clear and Coherent  Affect:  Full Range  Mood:  anxious, depressed  Thought process:  normal  Thought content:    WNL  Sensory/Perceptual disturbances:    WNL  Orientation:  x4  Attention:  Good  Concentration:  Good  Memory:  WNL  Fund of knowledge:   Good  Insight:    Good  Judgment:   Good  Impulse Control:  Good   Reported Symptoms: Anxiety, distractible, intermittent depressed mood, rumination, negative self talk  Risk Assessment: Danger to Self:  No Self-injurious Behavior: No Danger to Others: No Duty to Warn:no Physical Aggression / Violence:No  Access to Firearms a concern: No  Gang Involvement:No  Patient / guardian was educated about steps to take if suicide or homicide risk level increases between visits: yes While future psychiatric events cannot be accurately predicted, the patient does not currently require acute inpatient psychiatric care and does not currently meet [facility] involuntary commitment criteria.   Medical History/Surgical History: Past Medical History:  Diagnosis Date  . ADHD   . Anal pain   . Depression   . Depression   . GAD (generalized anxiety disorder)   . Rectal mass   . Wears glasses     Past Surgical History:  Procedure Laterality Date  . EVALUATION UNDER ANESTHESIA WITH FISTULECTOMY N/A 02/09/2016   Procedure: EXAM UNDER ANESTHESIA with EXCISIONAL BIOPSY OF ANAL CANAL MASS;  Surgeon: Romie Levee, MD;  Location: [facility];  Service: General;  Laterality: N/A;  . SIGMOIDOSCOPY  01/2015  . SPHINCTEROTOMY N/A 02/09/2016   Procedure: CHEMICAL SPHINCTEROTOMY;  Surgeon: Romie Levee,  MD;  Location: [facility];  Service: General;  Laterality: N/A;    Medications: Current Outpatient Medications  Medication Sig Dispense Refill  . amphetamine-dextroamphetamine (ADDERALL) 30 MG tablet Take one tab total 30 mg every morning and 1/2 tab total 15 mg every 3 PM 45 tablet 0  . [START ON 05/15/2020] amphetamine-dextroamphetamine (ADDERALL) 30 MG tablet Take one tab total 30 mg every morning and 1/2 tab total 15 mg every 3 PM 45 tablet 0  . [START ON 06/12/2020] amphetamine-dextroamphetamine (ADDERALL) 30 MG tablet Take one tab total 30 mg every morning and 1/2 tab total 15 mg every 3 PM 45 tablet 0  . DULoxetine (CYMBALTA) 60 MG capsule Take 1 capsule (60 mg total) by mouth daily after breakfast. 30 capsule 3   No current facility-administered medications for this visit.   Subjective:   Patient presents for session.  Patient shared progress and events since last visit which was about 3 months ago.  He stated he continues to move through the grief process related to the loss of his father, feels that he is obtained considerable closure.  Continues to have moments where the pain of the loss affects him.  Stated that he carries a level of emotional distress, can get easily agitated, upset at times tearful throughout the week.  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  Through guided discovery, he identified the need to be more consistent with his daily schedule where he was able to identify  plans in session and ways to improve his consistency in this area.  Reviewed coping skills related to dealing with the emotional distress and moments throughout the day, grounding and 5, thought stopping.  Interventions: CBT, supportive therapy  Diagnoses:    ICD-10-CM   1. Social anxiety disorder  F40.10     Plan: Patient is to use CBT, mindfulness and coping skills to help manage decrease symptoms associated with their  diagnosis.  Patient to continue to use his calendar to achieve tasks to increase work functioning.  Patient to increase consistency with his daily schedule and allow pleasurable interests, time with family. Long-term goal:   Reduce overall level, frequency, and intensity  of his symptoms up to 80% of the time severity for at least 3 consecutive months.  Patient to continue to abstain from using alcohol.  Patient to journal between sessions.  Patient to continue to utilize his support system and work on his marital relationship through open communication and allowing himself to receive support.   Short-term goal:  Decrease emotionally distressing self talk such as thinking of the worse possible life outcome Allow feelings to be identified and processed related to the loss of his father Decrease ruminating, specifically regarding his communication in his marital relationship Improved communication in his marital relationship utilizing active listening   Assessment of progress:  progressing   Waldron Session, [facility]

## 2020-05-22 ENCOUNTER — Ambulatory Visit: Payer: 59 | Admitting: Mental Health

## 2020-06-05 ENCOUNTER — Ambulatory Visit (INDEPENDENT_AMBULATORY_CARE_PROVIDER_SITE_OTHER): Payer: 59 | Admitting: Mental Health

## 2020-06-05 ENCOUNTER — Other Ambulatory Visit: Payer: Self-pay

## 2020-06-05 DIAGNOSIS — F4010 Social phobia, unspecified: Secondary | ICD-10-CM | POA: Diagnosis not present

## 2020-06-05 NOTE — Progress Notes (Signed)
[facility]  Name: Lucas Herrera "[name]" Date: 06/05/20 MRN: [id number] DOB: [birth year] PCP: Philip Aspen, Limmie Patricia, MD  Time spent: 40 minutes  Treatment:   Individual therapy  Mental Status Exam:   Appearance:   Casual     Behavior:  Appropriate  Motor:  Normal  Speech/Language:   Clear and Coherent  Affect:  Full Range  Mood:  anxious, depressed  Thought process:  normal  Thought content:    WNL  Sensory/Perceptual disturbances:    WNL  Orientation:  x4  Attention:  Good  Concentration:  Good  Memory:  WNL  Fund of knowledge:   Good  Insight:    Good  Judgment:   Good  Impulse Control:  Good   Reported Symptoms: Anxiety, distractible, intermittent depressed mood, rumination, negative self talk  Risk Assessment: Danger to Self:  No Self-injurious Behavior: No Danger to Others: No Duty to Warn:no Physical Aggression / Violence:No  Access to Firearms a concern: No  Gang Involvement:No  Patient / guardian was educated about steps to take if suicide or homicide risk level increases between visits: yes While future psychiatric events cannot be accurately predicted, the patient does not currently require acute inpatient psychiatric care and does not currently meet [facility] involuntary commitment criteria.   Medical History/Surgical History: Past Medical History:  Diagnosis Date  . ADHD   . Anal pain   . Depression   . Depression   . GAD (generalized anxiety disorder)   . Rectal mass   . Wears glasses     Past Surgical History:  Procedure Laterality Date  . EVALUATION UNDER ANESTHESIA WITH FISTULECTOMY N/A 02/09/2016   Procedure: EXAM UNDER ANESTHESIA with EXCISIONAL BIOPSY OF ANAL CANAL MASS;  Surgeon: Romie Levee, MD;  Location: [facility];  Service: General;  Laterality: N/A;  . SIGMOIDOSCOPY  01/2015  . SPHINCTEROTOMY N/A 02/09/2016   Procedure: CHEMICAL SPHINCTEROTOMY;  Surgeon: Romie Levee,  MD;  [facility];  Service: General;  Laterality: N/A;    Medications: Current Outpatient Medications  Medication Sig Dispense Refill  . amphetamine-dextroamphetamine (ADDERALL) 30 MG tablet Take one tab total 30 mg every morning and 1/2 tab total 15 mg every 3 PM 45 tablet 0  . amphetamine-dextroamphetamine (ADDERALL) 30 MG tablet Take one tab total 30 mg every morning and 1/2 tab total 15 mg every 3 PM 45 tablet 0  . [START ON 06/12/2020] amphetamine-dextroamphetamine (ADDERALL) 30 MG tablet Take one tab total 30 mg every morning and 1/2 tab total 15 mg every 3 PM 45 tablet 0  . DULoxetine (CYMBALTA) 60 MG capsule Take 1 capsule (60 mg total) by mouth daily after breakfast. 30 capsule 3   No current facility-administered medications for this visit.   Subjective:   Patient presents for session a few minutes late.  Patient shared how he has had positive experiences with his family recently as they have been able to go camping.  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  He stated that he can struggle with motivation at times to follow through and cultivate more business, went on to share experiences regarding how he continues to work through the grief process related to the loss of his father.  He and his brother were able to divide assets of their father among themselves and family members.  Patient shared specific experiences, memories with his father and how he  plans to celebrate his memory with his ashes as he will be cremated.  Provide support throughout, facilitating his identifying feelings related as well as ways to continue to cope and care for himself which included his continuing to reach out to his wife for support and his plan to take steps to be more engaging with his business which he feels needs to be more of a focus going forward.   Interventions: CBT, supportive therapy  Diagnoses:     ICD-10-CM   1. Social anxiety disorder  F40.10     Plan: Patient is to use CBT, mindfulness and coping skills to help manage decrease symptoms associated with their diagnosis.  Patient to continue to use his calendar to achieve tasks to increase work functioning.  Patient to increase consistency with his daily schedule and allow pleasurable interests, time with family. Long-term goal:   Reduce overall level, frequency, and intensity  of his symptoms up to 80% of the time severity for at least 3 consecutive months.  Patient to continue to abstain from using alcohol.  Patient to journal between sessions.  Patient to continue to utilize his support system and work on his marital relationship through open communication and allowing himself to receive support.   Short-term goal:  Decrease emotionally distressing self talk such as thinking of the worse possible life outcome Allow feelings to be identified and processed related to the loss of his father Decrease ruminating, specifically regarding his communication in his marital relationship Improved communication in his marital relationship utilizing active listening   Assessment of progress:  progressing   Waldron Session, [facility]

## 2020-06-19 ENCOUNTER — Ambulatory Visit (INDEPENDENT_AMBULATORY_CARE_PROVIDER_SITE_OTHER): Payer: 59 | Admitting: Mental Health

## 2020-06-19 ENCOUNTER — Other Ambulatory Visit: Payer: Self-pay

## 2020-06-19 DIAGNOSIS — F4010 Social phobia, unspecified: Secondary | ICD-10-CM

## 2020-06-19 NOTE — Progress Notes (Signed)
[facility]  Name: Lucas Herrera "Lucas Herrera"  Date: 06/19/20 MRN: [id number] DOB: 1985-10-08 PCP: Lucas Herrera, Lucas Patricia, MD  Time spent: 45 minutes  Treatment:   Individual therapy  Mental Status Exam:   Appearance:   Casual     Behavior:  Appropriate  Motor:  Normal  Speech/Language:   Clear and Coherent  Affect:  Full Range  Mood:  anxious, pleasant  Thought process:  normal  Thought content:    WNL  Sensory/Perceptual disturbances:    WNL  Orientation:  x4  Attention:  Good  Concentration:  Good  Memory:  WNL  Fund of knowledge:   Good  Insight:    Good  Judgment:   Good  Impulse Control:  Good   Reported Symptoms: Anxiety, distractible, intermittent depressed mood, rumination, negative self talk  Risk Assessment: Danger to Self:  No Self-injurious Behavior: No Danger to Others: No Duty to Warn:no Physical Aggression / Violence:No  Access to Firearms a concern: No  Gang Involvement:No  Patient / guardian was educated about steps to take if suicide or homicide risk level increases between visits: yes While future psychiatric events cannot be accurately predicted, the patient does not currently require acute inpatient psychiatric care and does not currently meet [facility] involuntary commitment criteria.   Medical History/Surgical History: Past Medical History:  Diagnosis Date  . ADHD   . Anal pain   . Depression   . Depression   . GAD (generalized anxiety disorder)   . Rectal mass   . Wears glasses     Past Surgical History:  Procedure Laterality Date  . EVALUATION UNDER ANESTHESIA WITH FISTULECTOMY N/A 02/09/2016   Procedure: EXAM UNDER ANESTHESIA with EXCISIONAL BIOPSY OF ANAL CANAL MASS;  Surgeon: Lucas Levee, MD;  Location: [facility];  Service: General;  Laterality: N/A;  . SIGMOIDOSCOPY  01/2015  . SPHINCTEROTOMY N/A 02/09/2016   Procedure: CHEMICAL SPHINCTEROTOMY;  Surgeon: Lucas Levee,  MD;  Location: [facility];  Service: General;  Laterality: N/A;    Medications: Current Outpatient Medications  Medication Sig Dispense Refill  . amphetamine-dextroamphetamine (ADDERALL) 30 MG tablet Take one tab total 30 mg every morning and 1/2 tab total 15 mg every 3 PM 45 tablet 0  . amphetamine-dextroamphetamine (ADDERALL) 30 MG tablet Take one tab total 30 mg every morning and 1/2 tab total 15 mg every 3 PM 45 tablet 0  . amphetamine-dextroamphetamine (ADDERALL) 30 MG tablet Take one tab total 30 mg every morning and 1/2 tab total 15 mg every 3 PM 45 tablet 0  . DULoxetine (CYMBALTA) 60 MG capsule Take 1 capsule (60 mg total) by mouth daily after breakfast. 30 capsule 3   No current facility-administered medications for this visit.   Subjective:   Patient presents for session. Patient shared progress, how he and his wife have had some recent relationship issues. He said that he did not remember mother's day, and this upset his wife. He said that he thinks that since the children were not at home that day, it slipped his mind. Lucas Herrera how they have plans later today to spend time together in ways he plans to communicate more with her was shared. He continues to struggle with some feelings and anxiety, increasingly in social situations. Facilitated his identifying thoughts with which he could focus on to manage his anxiety and some of these situations for who's able to identify and share in session. 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   Interventions: CBT, supportive therapy  Diagnoses:    ICD-10-CM   1. Social anxiety disorder  F40.10     Plan: Patient is to use CBT, mindfulness and coping skills to help manage decrease symptoms associated with  their diagnosis.  Patient to continue to use his calendar to achieve tasks to increase work functioning.  Patient to increase consistency with his daily schedule and allow pleasurable interests, time with family. Long-term goal:   Reduce overall level, frequency, and intensity  of his symptoms up to 80% of the time severity for at least 3 consecutive months.  Patient to continue to abstain from using alcohol.  Patient to journal between sessions.  Patient to continue to utilize his support system and work on his marital relationship through open communication and allowing himself to receive support.   Short-term goal:  Decrease emotionally distressing self talk such as thinking of the worse possible life outcome Allow feelings to be identified and processed related to the loss of his father Decrease ruminating, specifically regarding his communication in his marital relationship Improved communication in his marital relationship utilizing active listening   Assessment of progress:  progressing   Lucas Herrera Session, [facility]

## 2020-06-30 ENCOUNTER — Encounter (HOSPITAL_BASED_OUTPATIENT_CLINIC_OR_DEPARTMENT_OTHER): Payer: Self-pay | Admitting: Surgery

## 2020-07-03 ENCOUNTER — Other Ambulatory Visit: Payer: Self-pay

## 2020-07-03 ENCOUNTER — Ambulatory Visit (INDEPENDENT_AMBULATORY_CARE_PROVIDER_SITE_OTHER): Payer: 59 | Admitting: Mental Health

## 2020-07-03 ENCOUNTER — Other Ambulatory Visit (HOSPITAL_COMMUNITY)
Admission: RE | Admit: 2020-07-03 | Discharge: 2020-07-03 | Disposition: A | Discharge status: Home / Self Care | Payer: 59 | Source: Ambulatory Visit | Attending: Surgery | Admitting: Surgery

## 2020-07-03 ENCOUNTER — Encounter (HOSPITAL_BASED_OUTPATIENT_CLINIC_OR_DEPARTMENT_OTHER): Payer: Self-pay | Admitting: Surgery

## 2020-07-03 ENCOUNTER — Ambulatory Visit: Payer: Self-pay | Admitting: Surgery

## 2020-07-03 DIAGNOSIS — Z01812 Encounter for preprocedural laboratory examination: Secondary | ICD-10-CM | POA: Diagnosis present

## 2020-07-03 DIAGNOSIS — Z20822 Contact with and (suspected) exposure to covid-19: Secondary | ICD-10-CM | POA: Diagnosis not present

## 2020-07-03 DIAGNOSIS — F4010 Social phobia, unspecified: Secondary | ICD-10-CM

## 2020-07-03 LAB — SARS CORONAVIRUS 2 (TAT 6-24 HRS): SARS Coronavirus 2: NEGATIVE

## 2020-07-03 NOTE — Progress Notes (Signed)
[facility]  Name: Lucas Herrera "Lucas Herrera"  Date: 07/03/20 MRN: [id number] DOB: 1985/07/27 PCP: Philip Aspen, Limmie Patricia, MD  Time spent: 48 minutes  Treatment:   Individual therapy  Mental Status Exam:   Appearance:   Casual     Behavior:  Appropriate  Motor:  Normal  Speech/Language:   Clear and Coherent  Affect:  Full Range  Mood:  anxious, pleasant  Thought process:  normal  Thought content:    WNL  Sensory/Perceptual disturbances:    WNL  Orientation:  x4  Attention:  Good  Concentration:  Good  Memory:  WNL  Fund of knowledge:   Good  Insight:    Good  Judgment:   Good  Impulse Control:  Good   Reported Symptoms: Anxiety, distractible, intermittent depressed mood, rumination, negative self talk  Risk Assessment: Danger to Self:  No Self-injurious Behavior: No Danger to Others: No Duty to Warn:no Physical Aggression / Violence:No  Access to Firearms a concern: No  Gang Involvement:No  Patient / guardian was educated about steps to take if suicide or homicide risk level increases between visits: yes While future psychiatric events cannot be accurately predicted, the patient does not currently require acute inpatient psychiatric care and does not currently meet [facility] involuntary commitment criteria.   Medical History/Surgical History: Past Medical History:  Diagnosis Date  . ADHD   . Anal pain   . Depression   . Depression   . GAD (generalized anxiety disorder)   . Rectal mass   . Wears glasses     Past Surgical History:  Procedure Laterality Date  . EVALUATION UNDER ANESTHESIA WITH FISTULECTOMY N/A 02/09/2016   Procedure: EXAM UNDER ANESTHESIA with EXCISIONAL BIOPSY OF ANAL CANAL MASS;  Surgeon: Romie Levee, MD;  Location: [facility];  Service: General;  Laterality: N/A;  . SIGMOIDOSCOPY  01/2015  . SPHINCTEROTOMY N/A 02/09/2016   Procedure: CHEMICAL SPHINCTEROTOMY;  Surgeon: Romie Levee,  MD;  Location: [facility];  Service: General;  Laterality: N/A;    Medications: Current Outpatient Medications  Medication Sig Dispense Refill  . amphetamine-dextroamphetamine (ADDERALL) 30 MG tablet Take one tab total 30 mg every morning and 1/2 tab total 15 mg every 3 PM 45 tablet 0  . amphetamine-dextroamphetamine (ADDERALL) 30 MG tablet Take one tab total 30 mg every morning and 1/2 tab total 15 mg every 3 PM 45 tablet 0  . amphetamine-dextroamphetamine (ADDERALL) 30 MG tablet Take one tab total 30 mg every morning and 1/2 tab total 15 mg every 3 PM 45 tablet 0  . DULoxetine (CYMBALTA) 60 MG capsule Take 1 capsule (60 mg total) by mouth daily after breakfast. 30 capsule 3   No current facility-administered medications for this visit.   Subjective:   Patient presents for session. Patient shared progress, how he and his wife are getting along better over the last 2 weeks. 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 He continues to cope with the loss of his father, continues to grieve but feels that he is moving through the grief process. Through guided discovery, identified feelings of guilt associated with some discussions he had with his father. His dad his father coped with depression for many years, increasingly prior to his passing. Facilitated  his identifying self supportive thoughts where he was able to "I know he coped with depression, nothing I might have said would have really changed anything". I supposed to only blocking out painful thoughts and feelings related, patient has worked to allow himself to experience the emotions at times related to this loss.  Interventions: CBT, supportive therapy  Diagnoses:    ICD-10-CM   1. Social anxiety disorder  F40.10     Plan:  Patient is to use CBT, mindfulness and coping skills to help manage decrease symptoms associated with their diagnosis.  Patient to continue to use his calendar to achieve tasks to increase work functioning.  Patient to increase consistency with his daily schedule and allow pleasurable interests, time with family.  Long-term goal:   Reduce overall level, frequency, and intensity  of his symptoms up to 80% of the time severity for at least 3 consecutive months.  Patient to continue to abstain from using alcohol.  Patient to journal between sessions.  Patient to continue to utilize his support system and work on his marital relationship through open communication and allowing himself to receive support.   Short-term goal:  Decrease emotionally distressing self talk such as thinking of the worse possible life outcome Allow feelings to be identified and processed related to the loss of his father Decrease ruminating, specifically regarding his communication in his marital relationship Improved communication in his marital relationship utilizing active listening   Assessment of progress:  progressing   Waldron Session, [facility]

## 2020-07-03 NOTE — H&P (View-Only) (Signed)
CC: Self referred to see me about perianal lesions, possible fistula as well?  HPI: Mr. Lucas Herrera is a very pleasant 34yoM with hx of anxiety and depression, ADHD and has a history of anal tags/hemorrhoids as well as what sounds like an anal fissure. He is taken the operating room by my partner, Dr. Maisie Herrera, on 02/09/2016, and underwent EUA radius and had a posterior midline anal fissure with an associated anal skin tag. He had an excisional biopsy of this tag as well as a chemical sphincterotomy using Botox. He recovered well from a fissure standpoint. Pathology of this biopsy returned benign fibroepithelial polyp consistent with hemorrhoid. No dysplasia or malignancy. He reports that the past couple of years he's had some perianal lesions that have cropped up. He is also noticed a similar kind of lesion on the penis. He was concerned this could be HPV.  He also notes some unusual symptoms that'll occur every couple of months where he'll have some drainage from the anal area after a couple of days of increasing perianal pain. The drainage she describes to be milky/puslike. Currently he is not having any pain. He reports that he's recently undergone STD testing and had a negative HIV test. He has not been checked for HPV in the past as far as he can recall.  He denies any rectal bleeding. He denies any history of incontinence to gas, liquid, or solid stool.  IOM:BTDH fissure, anxiety, depression, ADHD  PSH: 01/2016 - excision of skin tag, chemical sphincterotomy.  FHx: Denies FHx of colorectal, breast, endometrial, ovarian or cervical cancer  Social: Denies use of tobacco/EtOH. Uses marijuana multiple times per day. 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 He recently lost his father had a suicide ~01/2020 and is involved with both psychiatry and counseling  ROS: A comprehensive 10 system review of systems was completed with the patient and pertinent findings as noted above.  The  patient is a 35 year old male.   Past Surgical History Lucas Coots, Lucas Herrera; 06/13/2020 9:21 AM) Hemorrhoidectomy   Diagnostic Studies History Lucas Coots, Lucas Herrera; 06/13/2020 9:21 AM) Colonoscopy  never  Allergies Lucas Coots, Lucas Herrera; 06/13/2020 9:21 AM) No Known Drug Allergies  [12/19/2015]: Allergies Reconciled   Medication History Lucas Coots, Lucas Herrera; 06/13/2020 9:21 AM) Analpram HC (2.5-1% Cream, 1 (one) Application Rectal three times daily, as needed, Taken starting 12/19/2015) Active. (External use only. Throw away applicator please.) BuPROPion HCl (75MG  Tablet, Oral daily) Active. Medications Reconciled  Social History , Lucas Herrera; 06/13/2020 9:21 AM) Alcohol use  Moderate alcohol use, Recently quit alcohol use. Caffeine use  Carbonated beverages, Coffee. Illicit drug use  Prefer to discuss with provider. Tobacco use  Never smoker.  Family History 08/13/2020, Lucas Herrera; 06/13/2020 9:21 AM) Depression  Father. Thyroid problems  Mother.  Other Problems 08/13/2020, Lucas Herrera; 06/13/2020 9:21 AM) Anxiety Disorder  Depression  Hemorrhoids     Review of Systems 08/13/2020 Alston Lucas Herrera; 06/13/2020 9:21 AM) General Not Present- Appetite Loss, Chills, Fatigue, Fever, Night Sweats, Weight Gain and Weight Loss. Skin Present- Change in Wart/Mole. Not Present- Dryness, Hives, Jaundice, New Lesions, Non-Healing Wounds, Rash and Ulcer. HEENT Not Present- Earache, Hearing Loss, Hoarseness, Nose Bleed, Oral Ulcers, Ringing in the Ears, Seasonal Allergies, Sinus Pain, Sore Throat, Visual Disturbances, Wears glasses/contact lenses and Yellow Eyes. Breast Not Present- Breast Mass, Breast Pain, Nipple Discharge and Skin Changes. Gastrointestinal Present- Rectal Pain. Not Present- Abdominal Pain, Bloating, Bloody Stool, Change in Bowel Habits, Chronic diarrhea, Constipation, Difficulty Swallowing, Excessive gas,  Gets full quickly at meals, Hemorrhoids, Indigestion, Nausea and  Vomiting. Male Genitourinary Not Present- Blood in Urine, Change in Urinary Stream, Frequency, Impotence, Nocturia, Painful Urination, Urgency and Urine Leakage. Musculoskeletal Present- Back Pain and Joint Pain. Not Present- Joint Stiffness, Muscle Pain, Muscle Weakness and Swelling of Extremities. Neurological Not Present- Decreased Memory, Fainting, Headaches, Numbness, Seizures, Tingling, Tremor, Trouble walking and Weakness. Psychiatric Present- Anxiety, Depression and Frequent crying. Not Present- Bipolar, Change in Sleep Pattern and Fearful. Endocrine Not Present- Cold Intolerance, Excessive Hunger, Hair Changes, Heat Intolerance, Hot flashes and New Diabetes. Hematology Not Present- Blood Thinners, Easy Bruising, Excessive bleeding, Gland problems, HIV and Persistent Infections.  Vitals (Lucas Alston Lucas Herrera; 06/13/2020 9:22 AM) 06/13/2020 9:21 AM Weight: 198.25 lb Height: 75in Body Surface Area: 2.19 m Body Mass Index: 24.78 kg/m  Temp.: 98.26F  Pulse: 110 (Regular)  P.OX: 92% (Room air) BP: 126/80(Sitting, Left Arm, Standard)       Physical Exam Lucas Deer M. Lucas Sima MD; 06/13/2020 10:00 AM) The physical exam findings are as follows: Note: Constitutional: No acute distress; conversant; wearing mask Eyes: Moist conjunctiva; no lid lag; anicteric sclerae; pupils equal and round Neck: Trachea midline Lungs: Normal respiratory effort CV: rrr; no pitting edema GI: Abdomen soft, nontender, nondistended; no palpable hepatosplenomegaly Anorectal: Circumferential small perianal lesions consistent in appearance with condyloma. DRE-normal tone. No palpable masses. Anoscopy: Circumferential anoscopy demonstrates normal-appearing anoderm with small internal hemorrhoids. No granulation tissue or ulceration to suggest an obvious fistula. Some small condyloma within the anal os. GU: Multiple small nodules on penis suspicious for condyloma MSK: Normal gait Psychiatric:  Appropriate affect; alert and oriented 3  **A chaperone, Lucas Herrera, was present for this encounter    Assessment & Plan Lucas Deer M. 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 Garciagarcia is a very pleasant 34yoM with hx anxiety, depression, adhd here for evaluation of perianal lesions - clinically consistent in appearance with condyloma; also with hx of monthly to every couple of months of perianal pain for a couple of days and we'll then results in purulent drainage and improvement in his symptoms. Suspicious for a possible underlying perianal fistula. He does have a prior history of an anal fissure treated with my partner, Dr. Maisie Herrera. Impression: -The anatomy and physiology of the anal canal was discussed at length with the patient. The pathophysiology of anal warts/HPV was discussed at length with associated pictures and illustrations. We provided him a handout using the ASCRS handouts -We discussed that this is a transmitted infection since using an papilloma virus and importance of safe sex practices. He has recently had a negative HIV test 04/2020. -Topical Aldara to see if we can decrease number of lesions -Given perianal symptoms as well, discussed proceeding with anorectal exam under anesthesia, excision/fulguration of perianal condyloma, possible incision/drainage, possible fistultomy vs seton based on intraoperative findings -The planned procedures, material risks (including, but not limited to, pain, bleeding, infection, scarring, need for blood transfusion, damage to anal sphincter, recurrence of condyloma, incontinence of gas and/or stool, need for additional procedures, recurrence, pneumonia, heart attack, stroke, death) benefits and alternatives to surgery were discussed at length. The patient's questions were answered to his satisfaction, he voiced understanding and elected to  proceed with surgery. Additionally, we discussed typical postoperative expectations and the recovery process.  This patient encounter took 45 minutes today to perform the following: take history, perform exam, review outside records, interpret imaging, counsel the patient  on their diagnosis and document encounter, findings & plan in the EHR Current Plans ANOSCOPY, DIAGNOSTIC (26948) (Anoscopy: Circumferential anoscopy demonstrates normal-appearing anoderm with small internal hemorrhoids. No granulation tissue or ulceration to suggest an obvious fistula. Some small condyloma within the anal os.) Started Aldara 5 % External Cream, 1 (one) Application as directed, 30 Applicator, 60 days starting 06/13/2020, No Refill. Local Order: Patient Instructions: Apply 3 times per week (Mon/Wed/Fri) at night before bed, leaving in place for 6-10 hour. Wash off completely in morning with gentle soap in shower. Signed by Andria Meuse, MD (06/13/2020 10:04 AM)

## 2020-07-03 NOTE — Progress Notes (Signed)
Spoke w/ via phone for pre-op interview---PT Lab needs dos----   NONE            Lab results------ none COVID test ----07-03-2020 935 office scheduled pt Arrive 630 am 07-06-2020 NPO after MN NO Solid Food.  Clear liquids from MN until---530 am then npo Med rec completed Medications to take morning of surgery -----cymbalta Diabetic medication -----n/a Patient instructed to bring photo id and insurance card day of surgery Patient aware to have Driver (ride ) / caregiver  alyson forrester spouse will drop pt offPre-Op special Istructions -----fleets enema hs and am of surgery Patient verbalized understanding of instructions that were given at this phone interview. Patient denies shortness of breath, chest pain, fever, cough at this phone interview.

## 2020-07-03 NOTE — H&P (Signed)
CC: Self referred to see me about perianal lesions, possible fistula as well?  HPI: Lucas Herrera is a very pleasant 35yoM with hx of anxiety and depression, ADHD and has a history of anal tags/hemorrhoids as well as what sounds like an anal fissure. He is taken the operating room by my partner, Dr. Maisie Fus, on 02/09/2016, and underwent EUA radius and had a posterior midline anal fissure with an associated anal skin tag. He had an excisional biopsy of this tag as well as a chemical sphincterotomy using Botox. He recovered well from a fissure standpoint. Pathology of this biopsy returned benign fibroepithelial polyp consistent with hemorrhoid. No dysplasia or malignancy. He reports that the past couple of years he's had some perianal lesions that have cropped up. He is also noticed a similar kind of lesion on the penis. He was concerned this could be HPV.  He also notes some unusual symptoms that'll occur every couple of months where he'll have some drainage from the anal area after a couple of days of increasing perianal pain. The drainage she describes to be milky/puslike. Currently he is not having any pain. He reports that he's recently undergone STD testing and had a negative HIV test. He has not been checked for HPV in the past as far as he can recall.  He denies any rectal bleeding. He denies any history of incontinence to gas, liquid, or solid stool.  IOM:BTDH fissure, anxiety, depression, ADHD  PSH: 01/2016 - excision of skin tag, chemical sphincterotomy.  FHx: Denies FHx of colorectal, breast, endometrial, ovarian or cervical cancer  Social: Denies use of tobacco/EtOH. Uses marijuana multiple times per day. 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 He recently lost his father had a suicide ~01/2020 and is involved with both psychiatry and counseling  ROS: A comprehensive 10 system review of systems was completed with the patient and pertinent findings as noted above.  The  patient is a 35 year old male.   Past Surgical History Marliss Coots, CNA; 06/13/2020 9:21 AM) Hemorrhoidectomy   Diagnostic Studies History Marliss Coots, CNA; 06/13/2020 9:21 AM) Colonoscopy  never  Allergies Marliss Coots, CNA; 06/13/2020 9:21 AM) No Known Drug Allergies  [12/19/2015]: Allergies Reconciled   Medication History Marliss Coots, CNA; 06/13/2020 9:21 AM) Analpram HC (2.5-1% Cream, 1 (one) Application Rectal three times daily, as needed, Taken starting 12/19/2015) Active. (External use only. Throw away applicator please.) BuPROPion HCl (75MG  Tablet, Oral daily) Active. 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 Caffeine use  Carbonated beverages, Coffee. Illicit drug use  Prefer to discuss with provider. Tobacco use  Never smoker.  Family History 08/13/2020, CNA; 06/13/2020 9:21 AM) Depression  Father. Thyroid problems  Mother.  Other Problems 08/13/2020, CNA; 06/13/2020 9:21 AM) Anxiety Disorder  Depression  Hemorrhoids     Review of Systems 08/13/2020 Alston CNA; 06/13/2020 9:21 AM) General Not Present- Appetite Loss, Chills, Fatigue, Fever, Night Sweats, Weight Gain and Weight Loss. Skin Present- Change in Wart/Mole. Not Present- Dryness, Hives, Jaundice, New Lesions, Non-Healing Wounds, Rash and Ulcer. HEENT Not Present- Earache, Hearing Loss, Hoarseness, Nose Bleed, Oral Ulcers, Ringing in the Ears, Seasonal Allergies, Sinus Pain, Sore Throat, Visual Disturbances, Wears glasses/contact lenses and Yellow Eyes. Breast Not Present- Breast Mass, Breast Pain, Nipple Discharge and Skin Changes. Gastrointestinal Present- Rectal Pain. Not Present- Abdominal Pain, Bloating, Bloody Stool, Change in Bowel Habits, Chronic diarrhea, Constipation, Difficulty Swallowing, Excessive gas,  Gets full quickly at meals, Hemorrhoids, Indigestion, Nausea and  Vomiting. Male Genitourinary Not Present- Blood in Urine, Change in Urinary Stream, Frequency, Impotence, Nocturia, Painful Urination, Urgency and Urine Leakage. Musculoskeletal Present- Back Pain and Joint Pain. Not Present- Joint Stiffness, Muscle Pain, Muscle Weakness and Swelling of Extremities. Neurological Not Present- Decreased Memory, Fainting, Headaches, Numbness, Seizures, Tingling, Tremor, Trouble walking and Weakness. Psychiatric Present- Anxiety, Depression and Frequent crying. Not Present- Bipolar, Change in Sleep Pattern and Fearful. Endocrine Not Present- Cold Intolerance, Excessive Hunger, Hair Changes, Heat Intolerance, Hot flashes and New Diabetes. Hematology Not Present- Blood Thinners, Easy Bruising, Excessive bleeding, Gland problems, HIV and Persistent Infections.  Vitals (Donyelle Alston CNA; 06/13/2020 9:22 AM) 06/13/2020 9:21 AM Weight: 198.25 lb Height: 75in Body Surface Area: 2.19 m Body Mass Index: 24.78 kg/m  Temp.: 98.26F  Pulse: 110 (Regular)  P.OX: 92% (Room air) BP: 126/80(Sitting, Left Arm, Standard)       Physical Exam Cristal Deer M. Zaliyah Meikle MD; 06/13/2020 10:00 AM) The physical exam findings are as follows: Note: Constitutional: No acute distress; conversant; wearing mask Eyes: Moist conjunctiva; no lid lag; anicteric sclerae; pupils equal and round Neck: Trachea midline Lungs: Normal respiratory effort CV: rrr; no pitting edema GI: Abdomen soft, nontender, nondistended; no palpable hepatosplenomegaly Anorectal: Circumferential small perianal lesions consistent in appearance with condyloma. DRE-normal tone. No palpable masses. Anoscopy: Circumferential anoscopy demonstrates normal-appearing anoderm with small internal hemorrhoids. No granulation tissue or ulceration to suggest an obvious fistula. Some small condyloma within the anal os. GU: Multiple small nodules on penis suspicious for condyloma MSK: Normal gait Psychiatric:  Appropriate affect; alert and oriented 3  **A chaperone, Harold Barban, was present for this encounter    Assessment & Plan Cristal Deer M. 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 Suspicious for a possible underlying perianal fistula. He does have a prior history of an anal fissure treated with my partner, Dr. Maisie Fus. Impression: -The anatomy and physiology of the anal canal was discussed at length with the patient. The pathophysiology of anal warts/HPV was discussed at length with associated pictures and illustrations. We provided him a handout using the ASCRS handouts -We discussed that this is a transmitted infection since using an papilloma virus and importance of safe sex practices. He has recently had a negative HIV test 04/2020. -Topical Aldara to see if we can decrease number of lesions -Given perianal symptoms as well, discussed proceeding with anorectal exam under anesthesia, excision/fulguration of perianal condyloma, possible incision/drainage, possible fistultomy vs seton based on intraoperative findings -The planned procedures, material risks (including, but not limited to, pain, bleeding, infection, scarring, need for blood transfusion, damage to anal sphincter, recurrence of condyloma, incontinence of gas and/or stool, need for additional procedures, recurrence, pneumonia, heart attack, stroke, death) benefits and alternatives to surgery were discussed at length. The patient's questions were answered to his satisfaction, he voiced understanding and elected to  proceed with surgery. Additionally, we discussed typical postoperative expectations and the recovery process.  This patient encounter took 45 minutes today to perform the following: take history, perform exam, review outside records, interpret imaging, counsel the patient  on their diagnosis and document encounter, findings & plan in the EHR Current Plans ANOSCOPY, DIAGNOSTIC (26948) (Anoscopy: Circumferential anoscopy demonstrates normal-appearing anoderm with small internal hemorrhoids. No granulation tissue or ulceration to suggest an obvious fistula. Some small condyloma within the anal os.) Started Aldara 5 % External Cream, 1 (one) Application as directed, 30 Applicator, 60 days starting 06/13/2020, No Refill. Local Order: Patient Instructions: Apply 3 times per week (Mon/Wed/Fri) at night before bed, leaving in place for 6-10 hour. Wash off completely in morning with gentle soap in shower. Signed by Andria Meuse, MD (06/13/2020 10:04 AM)

## 2020-07-06 ENCOUNTER — Encounter (HOSPITAL_BASED_OUTPATIENT_CLINIC_OR_DEPARTMENT_OTHER): Payer: Self-pay | Admitting: Surgery

## 2020-07-06 ENCOUNTER — Ambulatory Visit (HOSPITAL_BASED_OUTPATIENT_CLINIC_OR_DEPARTMENT_OTHER): Payer: 59 | Admitting: Anesthesiology

## 2020-07-06 ENCOUNTER — Ambulatory Visit (HOSPITAL_BASED_OUTPATIENT_CLINIC_OR_DEPARTMENT_OTHER)
Admission: RE | Admit: 2020-07-06 | Discharge: 2020-07-06 | Disposition: A | Discharge status: Home / Self Care | Payer: 59 | Attending: Surgery | Admitting: Surgery

## 2020-07-06 ENCOUNTER — Other Ambulatory Visit: Payer: Self-pay

## 2020-07-06 ENCOUNTER — Encounter (HOSPITAL_BASED_OUTPATIENT_CLINIC_OR_DEPARTMENT_OTHER)
Admission: RE | Disposition: A | Discharge status: Home / Self Care | Payer: Self-pay | Source: Home / Self Care | Attending: Surgery

## 2020-07-06 DIAGNOSIS — K648 Other hemorrhoids: Secondary | ICD-10-CM | POA: Insufficient documentation

## 2020-07-06 DIAGNOSIS — A630 Anogenital (venereal) warts: Secondary | ICD-10-CM | POA: Diagnosis not present

## 2020-07-06 DIAGNOSIS — Z8719 Personal history of other diseases of the digestive system: Secondary | ICD-10-CM | POA: Insufficient documentation

## 2020-07-06 DIAGNOSIS — K6282 Dysplasia of anus: Secondary | ICD-10-CM | POA: Diagnosis not present

## 2020-07-06 DIAGNOSIS — L989 Disorder of the skin and subcutaneous tissue, unspecified: Secondary | ICD-10-CM | POA: Diagnosis present

## 2020-07-06 HISTORY — PX: WART FULGURATION: SHX5245

## 2020-07-06 HISTORY — PX: RECTAL EXAM UNDER ANESTHESIA: SHX6399

## 2020-07-06 HISTORY — DX: Anogenital (venereal) warts: A63.0

## 2020-07-06 HISTORY — PX: INCISION AND DRAINAGE ABSCESS: SHX5864

## 2020-07-06 SURGERY — FULGURATION, WART, ANUS
Anesthesia: General | Site: Rectum

## 2020-07-06 MED ORDER — ONDANSETRON HCL 4 MG/2ML IJ SOLN
INTRAMUSCULAR | Status: AC
  Filled 2020-07-06: qty 2

## 2020-07-06 MED ORDER — ACETAMINOPHEN 500 MG PO TABS
ORAL_TABLET | ORAL | Status: AC
  Filled 2020-07-06: qty 2

## 2020-07-06 MED ORDER — PROPOFOL 10 MG/ML IV BOLUS
INTRAVENOUS | Status: AC
  Filled 2020-07-06: qty 20

## 2020-07-06 MED ORDER — ACETAMINOPHEN 500 MG PO TABS
1000.0000 mg | ORAL_TABLET | ORAL | Status: AC
  Administered 2020-07-06: 1000 mg via ORAL

## 2020-07-06 MED ORDER — DEXAMETHASONE SODIUM PHOSPHATE 10 MG/ML IJ SOLN
INTRAMUSCULAR | Status: AC
  Filled 2020-07-06: qty 1

## 2020-07-06 MED ORDER — DEXAMETHASONE SODIUM PHOSPHATE 10 MG/ML IJ SOLN
INTRAMUSCULAR | Status: DC | PRN
  Administered 2020-07-06: 10 mg via INTRAVENOUS

## 2020-07-06 MED ORDER — MIDAZOLAM HCL 2 MG/2ML IJ SOLN
INTRAMUSCULAR | Status: AC
  Filled 2020-07-06: qty 2

## 2020-07-06 MED ORDER — LACTATED RINGERS IV SOLN
INTRAVENOUS | Status: DC
  Administered 2020-07-06: 1000 mL via INTRAVENOUS

## 2020-07-06 MED ORDER — FENTANYL CITRATE (PF) 100 MCG/2ML IJ SOLN: 25.0000 ug | INTRAMUSCULAR | Status: DC | PRN

## 2020-07-06 MED ORDER — BUPIVACAINE-EPINEPHRINE 0.25% -1:200000 IJ SOLN
INTRAMUSCULAR | Status: DC | PRN
  Administered 2020-07-06: 30 mL

## 2020-07-06 MED ORDER — MIDAZOLAM HCL 5 MG/5ML IJ SOLN
INTRAMUSCULAR | Status: DC | PRN
  Administered 2020-07-06: 2 mg via INTRAVENOUS

## 2020-07-06 MED ORDER — FENTANYL CITRATE (PF) 100 MCG/2ML IJ SOLN
INTRAMUSCULAR | Status: DC | PRN
  Administered 2020-07-06: 50 ug via INTRAVENOUS
  Administered 2020-07-06: 75 ug via INTRAVENOUS
  Administered 2020-07-06 (×2): 25 ug via INTRAVENOUS

## 2020-07-06 MED ORDER — ROCURONIUM BROMIDE 10 MG/ML (PF) SYRINGE
PREFILLED_SYRINGE | INTRAVENOUS | Status: AC
  Filled 2020-07-06: qty 10

## 2020-07-06 MED ORDER — LIDOCAINE HCL (CARDIAC) PF 100 MG/5ML IV SOSY
PREFILLED_SYRINGE | INTRAVENOUS | Status: DC | PRN
  Administered 2020-07-06 (×2): 50 mg via INTRAVENOUS

## 2020-07-06 MED ORDER — SUGAMMADEX SODIUM 200 MG/2ML IV SOLN
INTRAVENOUS | Status: DC | PRN
  Administered 2020-07-06: 200 mg via INTRAVENOUS

## 2020-07-06 MED ORDER — BUPIVACAINE LIPOSOME 1.3 % IJ SUSP: 20.0000 mL | Freq: Once | INTRAMUSCULAR | Status: DC

## 2020-07-06 MED ORDER — FLEET ENEMA 7-19 GM/118ML RE ENEM: 1.0000 | ENEMA | Freq: Once | RECTAL | Status: DC

## 2020-07-06 MED ORDER — CHLORHEXIDINE GLUCONATE CLOTH 2 % EX PADS: 6.0000 | MEDICATED_PAD | Freq: Once | CUTANEOUS | Status: DC

## 2020-07-06 MED ORDER — DIBUCAINE (PERIANAL) 1 % EX OINT
TOPICAL_OINTMENT | CUTANEOUS | Status: DC | PRN
  Administered 2020-07-06: 1 via RECTAL

## 2020-07-06 MED ORDER — ONDANSETRON HCL 4 MG/2ML IJ SOLN
INTRAMUSCULAR | Status: DC | PRN
  Administered 2020-07-06: 4 mg via INTRAVENOUS

## 2020-07-06 MED ORDER — FENTANYL CITRATE (PF) 250 MCG/5ML IJ SOLN
INTRAMUSCULAR | Status: AC
  Filled 2020-07-06: qty 5

## 2020-07-06 MED ORDER — LIDOCAINE 2% (20 MG/ML) 5 ML SYRINGE
INTRAMUSCULAR | Status: AC
  Filled 2020-07-06: qty 5

## 2020-07-06 MED ORDER — DEXMEDETOMIDINE HCL 200 MCG/2ML IV SOLN
INTRAVENOUS | Status: DC | PRN
  Administered 2020-07-06: 10 ug via INTRAVENOUS

## 2020-07-06 MED ORDER — 0.9 % SODIUM CHLORIDE (POUR BTL) OPTIME
TOPICAL | Status: DC | PRN
  Administered 2020-07-06: 500 mL

## 2020-07-06 MED ORDER — DEXMEDETOMIDINE (PRECEDEX) IN NS 20 MCG/5ML (4 MCG/ML) IV SYRINGE
PREFILLED_SYRINGE | INTRAVENOUS | Status: AC
  Filled 2020-07-06: qty 5

## 2020-07-06 MED ORDER — ROCURONIUM BROMIDE 100 MG/10ML IV SOLN
INTRAVENOUS | Status: DC | PRN
  Administered 2020-07-06: 10 mg via INTRAVENOUS
  Administered 2020-07-06: 60 mg via INTRAVENOUS

## 2020-07-06 MED ORDER — TRAMADOL HCL 50 MG PO TABS: 50.0000 mg | ORAL_TABLET | Freq: Four times a day (QID) | ORAL | 0 refills | Status: AC | PRN

## 2020-07-06 MED ORDER — BUPIVACAINE LIPOSOME 1.3 % IJ SUSP
INTRAMUSCULAR | Status: DC | PRN
  Administered 2020-07-06: 20 mL

## 2020-07-06 MED ORDER — PROPOFOL 10 MG/ML IV BOLUS
INTRAVENOUS | Status: DC | PRN
  Administered 2020-07-06: 180 mg via INTRAVENOUS

## 2020-07-06 SURGICAL SUPPLY — 64 items
APL SKNCLS STERI-STRIP NONHPOA (GAUZE/BANDAGES/DRESSINGS) ×4
BENZOIN TINCTURE PRP APPL 2/3 (GAUZE/BANDAGES/DRESSINGS) ×6 IMPLANT
BLADE EXTENDED COATED 6.5IN (ELECTRODE) IMPLANT
BLADE HEX COATED 2.75 (ELECTRODE) IMPLANT
BLADE SURG 10 STRL SS (BLADE) IMPLANT
BLADE SURG 15 STRL LF DISP TIS (BLADE) ×2 IMPLANT
BLADE SURG 15 STRL SS (BLADE) ×3
BRIEF STRETCH FOR OB PAD LRG (UNDERPADS AND DIAPERS) ×3 IMPLANT
COVER BACK TABLE 60X90IN (DRAPES) ×3 IMPLANT
COVER MAYO STAND STRL (DRAPES) ×3 IMPLANT
COVER WAND RF STERILE (DRAPES) ×3 IMPLANT
DECANTER SPIKE VIAL GLASS SM (MISCELLANEOUS) ×3 IMPLANT
DRAPE HYSTEROSCOPY (MISCELLANEOUS) IMPLANT
DRAPE LAPAROTOMY 100X72 PEDS (DRAPES) ×3 IMPLANT
DRAPE SHEET LG 3/4 BI-LAMINATE (DRAPES) IMPLANT
DRAPE UTILITY XL STRL (DRAPES) ×3 IMPLANT
DRSG PAD ABDOMINAL 8X10 ST (GAUZE/BANDAGES/DRESSINGS) ×3 IMPLANT
GAUZE SPONGE 4X4 12PLY STRL (GAUZE/BANDAGES/DRESSINGS) ×3 IMPLANT
GAUZE SPONGE 4X4 3PLY NS LF (GAUZE/BANDAGES/DRESSINGS) ×3 IMPLANT
GLOVE SURG ENC MOIS LTX SZ7.5 (GLOVE) ×3 IMPLANT
GLOVE SURG UNDER LTX SZ8 (GLOVE) ×3 IMPLANT
GOWN STRL REUS W/TWL LRG LVL3 (GOWN DISPOSABLE) ×3 IMPLANT
HYDROGEN PEROXIDE 16OZ (MISCELLANEOUS) IMPLANT
IV CATH 14GX2 1/4 (CATHETERS) IMPLANT
IV CATH 18G SAFETY (IV SOLUTION) IMPLANT
KIT SIGMOIDOSCOPE (SET/KITS/TRAYS/PACK) IMPLANT
KIT TURNOVER CYSTO (KITS) ×3 IMPLANT
LEGGING LITHOTOMY PAIR STRL (DRAPES) IMPLANT
LOOP VESSEL MAXI BLUE (MISCELLANEOUS) IMPLANT
NDL SAFETY ECLIPSE 18X1.5 (NEEDLE) IMPLANT
NEEDLE HYPO 18GX1.5 SHARP (NEEDLE)
NEEDLE HYPO 22GX1.5 SAFETY (NEEDLE) ×3 IMPLANT
NEEDLE HYPO 25X1 1.5 SAFETY (NEEDLE) IMPLANT
NS IRRIG 500ML POUR BTL (IV SOLUTION) ×3 IMPLANT
PACK BASIN DAY SURGERY FS (CUSTOM PROCEDURE TRAY) ×3 IMPLANT
PENCIL SMOKE EVACUATOR (MISCELLANEOUS) ×3 IMPLANT
SPONGE HEMORRHOID 8X3CM (HEMOSTASIS) IMPLANT
SPONGE SURGIFOAM ABS GEL 12-7 (HEMOSTASIS) IMPLANT
SUCTION FRAZIER HANDLE 10FR (MISCELLANEOUS)
SUCTION TUBE FRAZIER 10FR DISP (MISCELLANEOUS) IMPLANT
SUT CHROMIC 2 0 SH (SUTURE) IMPLANT
SUT CHROMIC 3 0 SH 27 (SUTURE) IMPLANT
SUT CHROMIC 4 0 PS 2 18 (SUTURE) ×3 IMPLANT
SUT MNCRL AB 4-0 PS2 18 (SUTURE) IMPLANT
SUT SILK 0 PSL NDL (SUTURE) IMPLANT
SUT SILK 0 TIES 10X30 (SUTURE) IMPLANT
SUT SILK 2 0 (SUTURE)
SUT SILK 2-0 18XBRD TIE 12 (SUTURE) IMPLANT
SUT VIC AB 2-0 SH 27 (SUTURE)
SUT VIC AB 2-0 SH 27XBRD (SUTURE) IMPLANT
SUT VIC AB 3-0 SH 18 (SUTURE) IMPLANT
SUT VIC AB 3-0 SH 27 (SUTURE)
SUT VIC AB 3-0 SH 27X BRD (SUTURE) IMPLANT
SUT VIC AB 3-0 SH 27XBRD (SUTURE) IMPLANT
SUT VIC AB 4-0 P-3 18XBRD (SUTURE) IMPLANT
SUT VIC AB 4-0 P3 18 (SUTURE)
SYR 20ML LL LF (SYRINGE) IMPLANT
SYR BULB IRRIG 60ML STRL (SYRINGE) ×3 IMPLANT
SYR CONTROL 10ML LL (SYRINGE) ×3 IMPLANT
SYR TB 1ML LL NO SAFETY (SYRINGE) IMPLANT
TOWEL OR 17X26 10 PK STRL BLUE (TOWEL DISPOSABLE) ×3 IMPLANT
TRAY DSU PREP LF (CUSTOM PROCEDURE TRAY) ×3 IMPLANT
TUBE CONNECTING 12X1/4 (SUCTIONS) ×3 IMPLANT
YANKAUER SUCT BULB TIP NO VENT (SUCTIONS) ×3 IMPLANT

## 2020-07-06 NOTE — Discharge Instructions (Addendum)
Information for Discharge Teaching: EXPAREL (bupivacaine liposome injectable suspension)   Your surgeon gave you EXPAREL(bupivacaine) in your surgical incision to help control your pain after surgery.   EXPAREL is a local anesthetic that provides pain relief by numbing the tissue around the surgical site.  EXPAREL is designed to release pain medication over time and can control pain for up to 72 hours.  Depending on how you respond to EXPAREL, you may require less pain medication during your recovery.  Possible side effects:  Temporary loss of sensation or ability to move in the area where bupivacaine was injected.  Nausea, vomiting, constipation  Rarely, numbness and tingling in your mouth or lips, lightheadedness, or anxiety may occur.  Call your doctor right away if you think you may be experiencing any of these sensations, or if you have other questions regarding possible side effects.  Follow all other discharge instructions given to you by your surgeon or nurse. Eat a healthy diet and drink plenty of water or other fluids.  If you return to the hospital for any reason within 96 hours following the administration of EXPAREL, please inform your health care providers.Information for Discharge Teaching: EXPAREL (bupivacaine liposome injectable suspension)   Your surgeon gave you EXPAREL(bupivacaine) in your surgical incision to help control your pain after surgery.   EXPAREL is a local anesthetic that provides pain relief by numbing the tissue around the surgical site.  EXPAREL is designed to release pain medication over time and can control pain for up to 72 hours.  Depending on how you respond to EXPAREL, you may require less pain medication during your recovery.  Possible side effects:  Temporary loss of sensation or ability to move in the area where bupivacaine was injected.  Nausea, vomiting, constipation  Rarely, numbness and tingling in your mouth or lips,  lightheadedness, or anxiety may occur.  Call your doctor right away if you think you may be experiencing any of these sensations, or if you have other questions regarding possible side effects.  Follow all other discharge instructions given to you by your surgeon or nurse. Eat a healthy diet and drink plenty of water or other fluids.  If you return to the hospital for any reason within 96 hours following the administration of EXPAREL, please inform your health care providers. Post Anesthesia Home Care Instructions  Activity: Get plenty of rest for the remainder of the day. A responsible adult should stay with you for 24 hours following the procedure.  For the next 24 hours, DO NOT: -Drive a car -Advertising copywriter -Drink alcoholic beverages -Take any medication unless instructed by your physician -Make any legal decisions or sign important papers.  Meals: Start with liquid foods such as gelatin or soup. Progress to regular foods as tolerated. Avoid greasy, spicy, heavy foods. If nausea and/or vomiting occur, drink only clear liquids until the nausea and/or vomiting subsides. Call your physician if vomiting continues.  Special Instructions/Symptoms: Your throat may feel dry or sore from the anesthesia or the breathing tube placed in your throat during surgery. If this causes discomfort, gargle with warm salt water. The discomfort should disappear within 24 hours.  If you had a scopolamine patch placed behind your ear for the management of post- operative nausea and/or vomiting:  1. The medication in the patch is effective for 72 hours, after which it should be removed.  Wrap patch in a tissue and discard in the trash. Wash hands thoroughly with soap and water. 2. You may remove  the patch earlier than 72 hours if you experience unpleasant side effects which may include dry mouth, dizziness or visual disturbances. 3. Avoid touching the patch. Wash your hands with soap and water after contact  with the patch.   ANORECTAL SURGERY: POST OP INSTRUCTIONS  1. DIET: Follow a light bland diet the first 24 hours after arrival home, such as soup, liquids, crackers, etc.  Be sure to include lots of fluids daily.  Avoid fast food or heavy meals as your are more likely to get nauseated.  Eat a low fat diet the next few days after surgery.   2. Some bleeding with bowel movements is expected for the first couple of days but this should stop in between bowel movements  3. Take your usually prescribed home medications unless otherwise directed.  4. PAIN CONTROL: a. It is helpful to take an over-the-counter pain medication regularly for the first few days/weeks.  Choose from the following that works best for you: i. Ibuprofen (Advil, etc) Three 200mg  tabs every 6 hours as needed. ii. Acetaminophen (Tylenol, etc) 500-650mg  every 6 hours as needed iii. NOTE: You may take both of these medications together - most patients find it most helpful when alternating between the two (i.e. Ibuprofen at 6am, tylenol at 9am, ibuprofen at 12pm ...) b. A  prescription for pain medication may have been prescribed for you at discharge.  Take your pain medication as prescribed.  i. If you are having problems/concerns with the prescription medicine, please call for further advice.  5. Avoid getting constipated.  Between the surgery and the pain medications, it is common to experience some constipation.  Increasing fluid intake (64oz of water per day) and taking a fiber supplement (such as Metamucil, Citrucel, FiberCon) 1-2 times a day regularly will usually help prevent this problem from occurring.  Take Miralax (over the counter) 1-2x/day while taking a narcotic pain medication. If no bowel movement after 48hours, you may additionally take a laxative like a bottle of Milk of Magnesia which can be purchased over the counter. Avoid enemas if possible as these are often painful.   6. Watch out for diarrhea.  If you have  many loose bowel movements, simplify your diet to bland foods.  Stop any stool softeners and decrease your fiber supplement. If this worsens or does not improve, please call us.  7. Wash / shower every day.  If you were discharged with a dressing, you may remove this the day after your surgery. You may shower normally, getting soap/water on your wound, particularly after bowel movements.  8. Soaking in a warm bath filled a couple inches ("Sitz bath") is a great way to clean the area after a bowel movement and many patients find it is a way to soothe the area.  9. ACTIVITIES as tolerated:   a. You may resume regular (light) daily activities beginning the next day--such as daily self-care, walking, climbing stairs--gradually increasing activities as tolerated.  If you can walk 30 minutes without difficulty, it is safe to try more intense activity such as jogging, treadmill, bicycling, low-impact aerobics, etc. b. Refrain from any heavy lifting or straining for the first 2 weeks after your procedure, particularly if your surgery was for hemorrhoids. c. Avoid activities that make your pain worse d. You may drive when you are no longer taking prescription pain medication, you can comfortably wear a seatbelt, and you can safely maneuver your car and apply brakes.  10. FOLLOW UP in our office a.  Please call [facility] at [phone] to set up an appointment to see your surgeon in the office for a follow-up appointment approximately 2 weeks after your surgery. b. Make sure that you call for this appointment the day you arrive home to insure a convenient appointment time.  9. If you have disability or family leave forms that need to be completed, you may have them completed by your primary care physician's office; for return to work instructions, please ask our office staff and they will be happy to assist you in obtaining this documentation   When to call us [phone]: 1. Poor pain control 2. Reactions  / problems with new medications (rash/itching, etc)  3. Fever over 101.5 F (38.5 C) 4. Inability to urinate 5. Nausea/vomiting 6. Worsening swelling or bruising 7. Continued bleeding from incision. 8. Increased pain, redness, or drainage from the incision  The clinic staff is available to answer your questions during regular business hours (8:30am-5pm).  Please don't hesitate to call and ask to speak to one of our nurses for clinical concerns.   A surgeon from [facility] is always on call at the hospitals   If you have a medical emergency, go to the nearest emergency room or call 911.   [facility] [address], [place], [place]  [address] ? MAIN: [phone] FAX [phone] [email or web address]

## 2020-07-06 NOTE — Anesthesia Preprocedure Evaluation (Addendum)
Anesthesia Evaluation  Patient identified by MRN, date of birth, ID band Patient awake    Reviewed: Allergy & Precautions, NPO status , Patient's Chart, lab work & pertinent test results  Airway Mallampati: II  TM Distance: >3 FB     Dental   Pulmonary    breath sounds clear to auscultation       Cardiovascular negative cardio ROS   Rhythm:Regular Rate:Normal     Neuro/Psych    GI/Hepatic negative GI ROS, Neg liver ROS,   Endo/Other  negative endocrine ROS  Renal/GU negative Renal ROS     Musculoskeletal   Abdominal   Peds  Hematology   Anesthesia Other Findings   Reproductive/Obstetrics                             Anesthesia Physical Anesthesia Plan  ASA: II  Anesthesia Plan: General   Post-op Pain Management:    Induction: Intravenous  PONV Risk Score and Plan: 3 and Ondansetron, Dexamethasone and Midazolam  Airway Management Planned: Oral ETT  Additional Equipment:   Intra-op Plan:   Post-operative Plan: Extubation in OR  Informed Consent: I have reviewed the patients History and Physical, chart, labs and discussed the procedure including the risks, benefits and alternatives for the proposed anesthesia with the patient or authorized representative who has indicated his/her understanding and acceptance.     Dental advisory given  Plan Discussed with: CRNA and Anesthesiologist  Anesthesia Plan Comments:        Anesthesia Quick Evaluation

## 2020-07-06 NOTE — Anesthesia Procedure Notes (Signed)
Procedure Name: Intubation Date/Time: 07/06/2020 8:59 AM Performed by: Garrel Ridgel, CRNA Pre-anesthesia Checklist: Patient identified, Emergency Drugs available, Suction available and Patient being monitored Patient Re-evaluated:Patient Re-evaluated prior to induction Oxygen Delivery Method: Circle system utilized Preoxygenation: Pre-oxygenation with 100% oxygen Induction Type: IV induction Ventilation: Mask ventilation without difficulty Laryngoscope Size: Mac and 4 Grade View: Grade I Tube type: Oral Tube size: 7.5 mm Number of attempts: 1 Airway Equipment and Method: Stylet and Oral airway Placement Confirmation: ETT inserted through vocal cords under direct vision,  positive ETCO2 and breath sounds checked- equal and bilateral Secured at: 24 cm Tube secured with: Tape Dental Injury: Teeth and Oropharynx as per pre-operative assessment

## 2020-07-06 NOTE — Interval H&P Note (Signed)
History and Physical Interval Note:  07/06/2020 7:36 AM  Lucas Herrera  has presented today for surgery, with the diagnosis of CONDYLOMA, POSSIBLE ANAL FISTULA.  The various methods of treatment have been discussed with the patient. After consideration of procedure, material risks, benefits and other options for treatment, the patient has consented to  Procedure(s): EXCISION OF PERIANAL LESIONS (N/A) ANORECTAL EXAM UNDER ANESTHESIA (N/A) POSSIBLE INCISION AND DRAINAGE (N/A) POSSIBLE FISTULOTOMY VS SETON (N/A) as a surgical intervention.  The patient's history has been reviewed, patient examined, no change in status, stable for surgery.  I have reviewed the patient's chart and labs.  Questions were answered to the patient's satisfaction.     Stephanie Coup Jaylene Schrom

## 2020-07-06 NOTE — Transfer of Care (Signed)
Immediate Anesthesia Transfer of Care Note  Patient: Lucas Herrera  Procedure(s) Performed: EXCISION OF PERIANAL LESIONS and fulguration (N/A Rectum) ANORECTAL EXAM UNDER ANESTHESIA (N/A Rectum) INCISION AND DRAINAGE of perianal wound (N/A Rectum)  Patient Location: PACU  Anesthesia Type:General  Level of Consciousness: awake, alert , oriented and patient cooperative  Airway & Oxygen Therapy: Patient Spontanous Breathing and Patient connected to face mask oxygen  Post-op Assessment: Report given to RN and Post -op Vital signs reviewed and stable  Post vital signs: Reviewed and stable  Last Vitals:  Vitals Value Taken Time  BP    Temp    Pulse 100 07/06/20 0949  Resp 17 07/06/20 0949  SpO2 100 % 07/06/20 0949  Vitals shown include unvalidated device data.  Last Pain:  Vitals:   07/06/20 0706  TempSrc: Oral  PainSc: 0-No pain      Patients Stated Pain Goal: 7 (07/06/20 0706)  Complications: No complications documented.

## 2020-07-06 NOTE — Op Note (Signed)
07/06/2020  9:31 AM  PATIENT:  Lucas Herrera  35 y.o. 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 Excision of perianal skin lesion 1 x 1 cm 2. Fulguration of perianal skin lesions 3. Incision/drainage of perianal wound 4. Anorectal exam under anesthesia  SURGEON: Marin Olp, MD FACS  ANESTHESIA:   local and general  SPECIMEN:  Left lateral perianal skin lesion  DISPOSITION OF SPECIMEN:  PATHOLOGY  COUNTS:  Sponge, needle, and instrument counts were reported correct x2 at conclusion.  EBL: 1 mL  Drains: None  PLAN OF CARE: Discharge to home after PACU  PATIENT DISPOSITION:  PACU - hemodynamically stable.  INDICATION: 34yoM with perianal skin lesions, suspicious for condyloma on exam in office. Also had symptoms of intermittent perianal pain/drainage but without definitive external opening visualized to clearly suggest anal fistula. He has a history of anal fissure and underwent EUA/botox by one of my partners, Dr. Maisie Fus 02/09/16. His fissure type symptoms appeared to have resolved. He was seeing urology for possible penile condyloma as well.  OR FINDINGS: Perianal skin lesions circumferentially that are all quite small, 1 to 2 mm in size.  A conglomerate of these was excised for pathologic confirmation of the diagnosis which I suspect to be condyloma.  The other skin lesions were fulgurated.  These total at least 20 in number.  They are primarily confined to the external perianal skin.  There is no significant extension of these found within the anal canal.  There is a area of scarring on the left anterior perianal skin.  A small incision was created at this location to interrogate this area.  There is no evident communication with the anal canal to  suggest an active fistula.  DESCRIPTION: The patient was identified in the preoperative holding area and taken to the OR. SCDs were applied. He then underwent general endotracheal anesthesia without difficulty. The patient was then rolled onto the OR table in the prone jackknife position. Pressure points were then evaluated and padded. Benzoin was applied to the buttocks and they were gently taped apart.  He was then prepped and draped in usual sterile fashion.  A surgical timeout was performed indicating the correct patient, procedure, and positioning.  A perianal block was then created using a dilute mixture of 0.25% Marcaine with epinephrine and Exparel.  After ascertaining an appropriate level of anesthesia had been achieved, a well lubricated digital rectal exam was performed. This demonstrated no palpable masses.  A Hill-Ferguson anoscope was into the anal canal and circumferential inspection demonstrated normal-appearing anoderm.  Externally, there is evident condyloma.  I do not see any extension to the anal canal.  The perianal skin is palpated and there is some thickening/scarring in the left anterior lateral position around the location of the intersphincteric groove.  There is no condyloma involving this area.  A small skin incision was created this location and the wound was interrogated.  There is no evident communication with the anal canal to suggest an active anal fistula.  The small incision is intentionally left open.  A conglomerate of the condyloma seen on the left lateral perianal skin and these are excised sharply and passed off for pathologic evaluation.  The remaining condyloma totaling at least 20 number all 1 to 2 mm in size and are managed with fulguration  using electrocautery.  Perianal skin is then meticulously inspected and there is no evident condyloma visible/remaining.  The excision site is closed using a running 4-0 chromic suture.  Additional local anesthetic is infiltrated.   Topical Dibucaine is applied.  The buttocks are untaped.  A dressing consisting of 4 x 4's, ABD, and mesh underwear was placed.  He was rolled back onto a stretcher, awakened from anesthesia, extubated and transferred to recovery in satisfactory condition.  DISPOSITION: PACU in satisfactory condition.

## 2020-07-06 NOTE — Anesthesia Postprocedure Evaluation (Signed)
Anesthesia Post Note  Patient: Song Garris  Procedure(s) Performed: EXCISION OF PERIANAL LESIONS and fulguration (N/A Rectum) ANORECTAL EXAM UNDER ANESTHESIA (N/A Rectum) INCISION AND DRAINAGE of perianal wound (N/A Rectum)     Patient location during evaluation: PACU Anesthesia Type: General Level of consciousness: awake Pain management: pain level controlled Vital Signs Assessment: post-procedure vital signs reviewed and stable Respiratory status: spontaneous breathing Cardiovascular status: stable Postop Assessment: no apparent nausea or vomiting Anesthetic complications: no   No complications documented.  Last Vitals:  Vitals:   07/06/20 1000 07/06/20 1015  BP: 125/74 121/66  Pulse: 84 72  Resp: 15 16  Temp:  36.5 C  SpO2: 100% 99%    Last Pain:  Vitals:   07/06/20 1000  TempSrc:   PainSc: 0-No pain                 Doyle Tegethoff

## 2020-07-07 ENCOUNTER — Encounter (HOSPITAL_BASED_OUTPATIENT_CLINIC_OR_DEPARTMENT_OTHER): Payer: Self-pay | Admitting: Surgery

## 2020-07-07 LAB — SURGICAL PATHOLOGY

## 2020-07-17 ENCOUNTER — Other Ambulatory Visit: Payer: Self-pay

## 2020-07-17 ENCOUNTER — Ambulatory Visit (INDEPENDENT_AMBULATORY_CARE_PROVIDER_SITE_OTHER): Payer: 59 | Admitting: Mental Health

## 2020-07-17 DIAGNOSIS — F4010 Social phobia, unspecified: Secondary | ICD-10-CM

## 2020-07-17 NOTE — Progress Notes (Addendum)
[facility]  Name: Lucas Herrera "[name]"  Date: 07/17/20 MRN: [id number] DOB: 1985-05-17 PCP: Philip Aspen, Limmie Patricia, MD  Time spent:  50 minutes  Treatment:   Individual therapy  Mental Status Exam:   Appearance:   Casual     Behavior:  Appropriate  Motor:  Normal  Speech/Language:   Clear and Coherent  Affect:  Full Range  Mood:  anxious, pleasant  Thought process:  normal  Thought content:    WNL  Sensory/Perceptual disturbances:    WNL  Orientation:  x4  Attention:  Good  Concentration:  Good  Memory:  WNL  Fund of knowledge:   Good  Insight:    Good  Judgment:   Good  Impulse Control:  Good   Reported Symptoms: Anxiety, distractible, intermittent depressed mood, rumination, negative self talk  Risk Assessment: Danger to Self:  No Self-injurious Behavior: No Danger to Others: No Duty to Warn:no Physical Aggression / Violence:No  Access to Firearms a concern: No  Gang Involvement:No  Patient / guardian was educated about steps to take if suicide or homicide risk level increases between visits: yes While future psychiatric events cannot be accurately predicted, the patient does not currently require acute inpatient psychiatric care and does not currently meet [facility] involuntary commitment criteria.   Medical History/Surgical History: Past Medical History:  Diagnosis Date  . ADHD   . Anal pain   . Condyloma   . Depression   . Depression   . GAD (generalized anxiety disorder)   . Rectal mass   . Wears glasses     Past Surgical History:  Procedure Laterality Date  . EVALUATION UNDER ANESTHESIA WITH FISTULECTOMY N/A 02/09/2016   Procedure: EXAM UNDER ANESTHESIA with EXCISIONAL BIOPSY OF ANAL CANAL MASS;  Surgeon: Romie Levee, MD;  Location: [facility] [place];  Service: General;  Laterality: N/A;  . INCISION AND DRAINAGE ABSCESS N/A 07/06/2020   Procedure: INCISION AND DRAINAGE of perianal wound;   Surgeon: Andria Meuse, MD;  Location: [facility];  Service: General;  Laterality: N/A;  . RECTAL EXAM UNDER ANESTHESIA N/A 07/06/2020   Procedure: ANORECTAL EXAM UNDER ANESTHESIA;  Surgeon: Andria Meuse, MD;  Location: [facility] [place];  Service: General;  Laterality: N/A;  . SIGMOIDOSCOPY  01/2015  . SPHINCTEROTOMY N/A 02/09/2016   Procedure: CHEMICAL SPHINCTEROTOMY;  Surgeon: Romie Levee, MD;  Location: [facility];  Service: General;  Laterality: N/A;  . WART FULGURATION N/A 07/06/2020   Procedure: EXCISION OF PERIANAL LESIONS and fulguration;  Surgeon: Andria Meuse, MD;  Location: [facility];  Service: General;  Laterality: N/A;    Medications: Current Outpatient Medications  Medication Sig Dispense Refill  . amphetamine-dextroamphetamine (ADDERALL) 30 MG tablet Take one tab total 30 mg every morning and 1/2 tab total 15 mg every 3 PM (Patient taking differently: Take one tab total 30 mg every morning and 1/2 tab total 15 mg every 3 PM PRN) 45 tablet 0  . DULoxetine (CYMBALTA) 60 MG capsule Take 1 capsule (60 mg total) by mouth daily after breakfast. 30 capsule 3   No current facility-administered medications for this visit.   Subjective:   Patient presents for session.  He shared how he relationship recently.  He stated his children are to complete school this week and begin summer break.  Identifying the need for having his own "downtime", he knows the summertime will be challenging in some ways in this regard, however, also  looks forward to spending a lot of quality time with his children.  He continues to work from home and this will be helpful with the children over the summer.  Reports having less crying spells, reports they persist at times however, going on to share continued feelings related to the loss of his father.  Continues to carry various emotions related, such as guilt as he regrets not  recognizing the significance of his father's depression.  He is able to rationalize in his mind the importance of not blaming himself, understanding this is part of the grief process while also not suppressing emotions related.  Collaboratively, we explored him which thoughts he feels will be helpful with which to cope.  Interventions: CBT, supportive therapy  Diagnoses:    ICD-10-CM   1. Social anxiety disorder  F40.10     Plan: Patient is to use CBT, mindfulness and coping skills to help manage decrease symptoms associated with their diagnosis.  Patient to continue to use his calendar to achieve tasks to increase work functioning.  Patient to increase consistency with his daily schedule and allow pleasurable interests, time with family.  Long-term goal:   Reduce overall level, frequency, and intensity  of his symptoms up to 80% of the time severity for at least 3 consecutive months.  Patient to continue to abstain from using alcohol.  Patient to journal between sessions.  Patient to continue to utilize his support system and work on his marital relationship through open communication and allowing himself to receive support.   Short-term goal:  Decrease emotionally distressing self talk such as thinking of the worse possible life outcome Allow feelings to be identified and processed related to the loss of his father Decrease ruminating, specifically regarding his communication in his marital relationship Improved communication in his marital relationship utilizing active listening   Assessment of progress:  progressing   Waldron Session, [facility]

## 2020-07-19 ENCOUNTER — Encounter: Payer: Self-pay | Admitting: Adult Health

## 2020-07-19 ENCOUNTER — Ambulatory Visit (INDEPENDENT_AMBULATORY_CARE_PROVIDER_SITE_OTHER): Payer: 59 | Admitting: Adult Health

## 2020-07-19 ENCOUNTER — Other Ambulatory Visit: Payer: Self-pay

## 2020-07-19 DIAGNOSIS — F4010 Social phobia, unspecified: Secondary | ICD-10-CM | POA: Diagnosis not present

## 2020-07-19 DIAGNOSIS — F331 Major depressive disorder, recurrent, moderate: Secondary | ICD-10-CM

## 2020-07-19 DIAGNOSIS — Z634 Disappearance and death of family member: Secondary | ICD-10-CM

## 2020-07-19 DIAGNOSIS — F900 Attention-deficit hyperactivity disorder, predominantly inattentive type: Secondary | ICD-10-CM

## 2020-07-19 DIAGNOSIS — F902 Attention-deficit hyperactivity disorder, combined type: Secondary | ICD-10-CM | POA: Diagnosis not present

## 2020-07-19 MED ORDER — AMPHETAMINE-DEXTROAMPHETAMINE 30 MG PO TABS: ORAL_TABLET | ORAL | 0 refills | Status: DC

## 2020-07-19 MED ORDER — DULOXETINE HCL 60 MG PO CPEP: 60.0000 mg | ORAL_CAPSULE | Freq: Every day | ORAL | 5 refills | Status: DC

## 2020-07-19 NOTE — Progress Notes (Signed)
Nazaiah Navarrete [id number] 08/16/1985 35 y.o.  Subjective:   Patient ID:  Lucas Herrera is a 35 y.o. (DOB 1985-05-23) male.  Chief Complaint: No chief complaint on file.   HPI Lucas Herrera presents to the office today for follow-up of ADHD, SAD, and bereavement.   Describes mood today as "ok". Pleasant. Tearful at times. Mood symptoms - reports depression, anxiety, and irritability - "a normal amount". Working through loss of father. He and family doing well. Planning a beach trip - trip to [place]. Seeing therapist with [name]. Stable interest and motivation. Taking medications as prescribed.  Energy levels stable. Active, has a regular exercise routine - 3 to 4 times a week.  Enjoys some usual interests and activities. Married. Lives with wife and 2 children. Spending time with family. Appetite adequate. Weight stable - 205 pounds. Sleeps well most nights. Averages 6 to 8 hours. Focus and concentration stable. Completing tasks. 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  Denies AH or VH.  Previous medication trials: Unknown   Secondary school teacher Row Office Visit from 04/19/2020 in [facility] from 10/21/2019 in [facility] Total Score 2 3  PHQ-9 Total Score 15 12    Flowsheet Row Admission (Discharged) from 07/06/2020 in [facility]  C-SSRS RISK CATEGORY No Risk       Review of Systems:  Review of Systems  Musculoskeletal: Negative for gait problem.  Neurological: Negative for tremors.  Psychiatric/Behavioral:       Please refer to HPI    Medications: I have reviewed the patient's current medications.  Current Outpatient Medications  Medication Sig Dispense Refill  . [START ON 08/16/2020] amphetamine-dextroamphetamine (ADDERALL) 30 MG tablet Take one tablet (30mg ) every morning and 1/2 tab total (15mg ) every day at 3pm. 45 tablet 0  . [START ON 09/13/2020]  amphetamine-dextroamphetamine (ADDERALL) 30 MG tablet Take one tablet (30mg ) every morning and 1/2 tab total (15mg ) every day at 3pm. 45 tablet 0  . amphetamine-dextroamphetamine (ADDERALL) 30 MG tablet Take one tab total 30 mg every morning and 1/2 tab total 15 mg every 3 PM. 45 tablet 0  . DULoxetine (CYMBALTA) 60 MG capsule Take 1 capsule (60 mg total) by mouth daily after breakfast. 30 capsule 5   No current facility-administered medications for this visit.    Medication Side Effects: None  Allergies: No Known Allergies  Past Medical History:  Diagnosis Date  . ADHD   . Anal pain   . Condyloma   . Depression   . Depression   . GAD (generalized anxiety disorder)   . Rectal mass   . Wears glasses     Past Medical History, Surgical history, Social history, and Family history were reviewed and updated as appropriate.   Please see review of systems for further details on the patient's review from today.   Objective:   Physical Exam:  There were no vitals taken for this visit.  Physical Exam Constitutional:      General: He is not in acute distress. Musculoskeletal:        General: No deformity.  Neurological:     Mental Status: He is alert and oriented to person, place, and time.     Coordination: Coordination normal.  Psychiatric:        Attention and Perception: Attention and perception normal. He does not perceive auditory or visual hallucinations.        Mood and  Affect: Mood normal. Mood is not anxious or depressed. Affect is not labile, blunt, angry or inappropriate.        Speech: Speech normal.        Behavior: Behavior normal.        Thought Content: Thought content normal. Thought content is not paranoid or delusional. Thought content does not include homicidal or suicidal ideation. Thought content does not include homicidal or suicidal plan.        Cognition and Memory: Cognition and memory normal.        Judgment: Judgment normal.     Comments: Insight  intact     Lab Review:     Component Value Date/Time   NA 140 10/21/2019 0854   K 4.3 10/21/2019 0854   CL 102 10/21/2019 0854   CO2 31 10/21/2019 0854   GLUCOSE 86 10/21/2019 0854   BUN 10 10/21/2019 0854   CREATININE 0.76 10/21/2019 0854   CALCIUM 9.5 10/21/2019 0854   PROT 6.9 10/21/2019 0854   AST 22 10/21/2019 0854   ALT 16 10/21/2019 0854   BILITOT 0.6 10/21/2019 0854       Component Value Date/Time   WBC 5.9 10/21/2019 0854   RBC 4.88 10/21/2019 0854   HGB 15.7 10/21/2019 0854   HCT 46.1 10/21/2019 0854   PLT 258 10/21/2019 0854   MCV 94.5 10/21/2019 0854   MCH 32.2 10/21/2019 0854   MCHC 34.1 10/21/2019 0854   RDW 11.8 10/21/2019 0854   LYMPHSABS 2,018 10/21/2019 0854   EOSABS 18 10/21/2019 0854   BASOSABS 18 10/21/2019 0854    No results found for: POCLITH, LITHIUM   No results found for: PHENYTOIN, PHENOBARB, VALPROATE, CBMZ   .res Assessment: Plan:    Plan:  PDMP reviewed  1. Cymbalta 60mg  daily  2. Adderall 30mg  - take one and 1/2 tablets  120/85 89  Read and reviewed note with patient for accuracy.   RTC 3 months  Patient advised to contact office with any questions, adverse effects, or acute worsening in signs and symptoms.  Discussed potential benefits, risks, and side effects of stimulants with patient to include increased heart rate, palpitations, insomnia, increased anxiety, increased irritability, or decreased appetite.  Instructed patient to contact office if experiencing any significant tolerability issues.   Diagnoses and all orders for this visit:  Attention deficit hyperactivity disorder (ADHD), combined type  Social anxiety disorder -     DULoxetine (CYMBALTA) 60 MG capsule; Take 1 capsule (60 mg total) by mouth daily after breakfast.  Bereavement  Attention deficit hyperactivity disorder (ADHD), inattentive type, moderate -     amphetamine-dextroamphetamine (ADDERALL) 30 MG tablet; Take one tab total 30 mg every morning  and 1/2 tab total 15 mg every 3 PM. -     amphetamine-dextroamphetamine (ADDERALL) 30 MG tablet; Take one tablet (30mg ) every morning and 1/2 tab total (15mg ) every day at 3pm. -     amphetamine-dextroamphetamine (ADDERALL) 30 MG tablet; Take one tablet (30mg ) every morning and 1/2 tab total (15mg ) every day at 3pm.  Moderate recurrent major depression (HCC) -     DULoxetine (CYMBALTA) 60 MG capsule; Take 1 capsule (60 mg total) by mouth daily after breakfast.     Please see After Visit Summary for patient specific instructions.  Future Appointments  Date Time Provider Department Center  08/07/2020  9:00 AM , [facility] None  09/14/2020  1:45 PM , PA[facility]C [facility]  10/19/2020  8:00 AM Merlyn Conley, 08/09/2020,  NP CP-CP None    No orders of the defined types were placed in this encounter.   -------------------------------

## 2020-08-07 ENCOUNTER — Ambulatory Visit: Payer: 59 | Admitting: Mental Health

## 2020-09-14 ENCOUNTER — Ambulatory Visit: Payer: 59 | Admitting: Physician Assistant

## 2020-10-19 ENCOUNTER — Encounter: Payer: Self-pay | Admitting: Adult Health

## 2020-10-19 ENCOUNTER — Ambulatory Visit (INDEPENDENT_AMBULATORY_CARE_PROVIDER_SITE_OTHER): Payer: 59 | Admitting: Adult Health

## 2020-10-19 ENCOUNTER — Other Ambulatory Visit: Payer: Self-pay

## 2020-10-19 DIAGNOSIS — F900 Attention-deficit hyperactivity disorder, predominantly inattentive type: Secondary | ICD-10-CM | POA: Diagnosis not present

## 2020-10-19 DIAGNOSIS — F331 Major depressive disorder, recurrent, moderate: Secondary | ICD-10-CM

## 2020-10-19 DIAGNOSIS — F4010 Social phobia, unspecified: Secondary | ICD-10-CM | POA: Diagnosis not present

## 2020-10-19 MED ORDER — DULOXETINE HCL 60 MG PO CPEP: 60.0000 mg | ORAL_CAPSULE | Freq: Every day | ORAL | 5 refills | Status: DC

## 2020-10-19 MED ORDER — AMPHETAMINE-DEXTROAMPHETAMINE 30 MG PO TABS: ORAL_TABLET | ORAL | 0 refills | Status: DC

## 2020-10-19 NOTE — Progress Notes (Signed)
[name] [id number] 1986/02/01 35 y.o.  Subjective:   Patient ID:  Lucas Herrera is a 35 y.o. (DOB 02/22/1985) male.  Chief Complaint: No chief complaint on file.   HPI  [name] presents to the office today for follow-up of ADHD, SAD, and bereavement.  Describes mood today as "ok". Pleasant. Tearful at times. Mood symptoms - reports depression, anxiety, and irritability - "just the usual". Reporting "some" anxiety attacks. Stating "things are better, but it's not all gone". Continues to grieve loss of father. He and family doing well. Seeing therapist with [name]. Stable interest and motivation. Taking medications as prescribed.  Energy levels stable. Active, has a regular exercise routine - 3 to 4 times a week.  Enjoys some usual interests and activities. Married. Lives with wife and 2 children. Spending time with family. Appetite adequate. Weight stable - 205 pounds. Sleeps well most nights. Averages 6 to 8 hours. Focus and concentration stable. Completing tasks. 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  Denies AH or VH.  Previous medication trials: Unknown  Oceanographer Row Office Visit from 04/19/2020 in [facility] from 10/21/2019 in [facility] Total Score 2 3  PHQ-9 Total Score 15 12      Flowsheet Row Admission (Discharged) from 07/06/2020 in [facility]  C-SSRS RISK CATEGORY No Risk        Review of Systems:  Review of Systems  Musculoskeletal:  Negative for gait problem.  Neurological:  Negative for tremors.  Psychiatric/Behavioral:         Please refer to HPI   Medications: I have reviewed the patient's current medications.  Current Outpatient Medications  Medication Sig Dispense Refill   amphetamine-dextroamphetamine (ADDERALL) 30 MG tablet Take one tab total 30 mg every morning and 1/2 tab total 15 mg every 3 PM. 45 tablet  0   [START ON 11/16/2020] amphetamine-dextroamphetamine (ADDERALL) 30 MG tablet Take one tablet (30mg ) every morning and 1/2 tab total (15mg ) every day at 3pm. 45 tablet 0   [START ON 12/14/2020] amphetamine-dextroamphetamine (ADDERALL) 30 MG tablet Take one tablet (30mg ) every morning and 1/2 tab total (15mg ) every day at 3pm. 45 tablet 0   DULoxetine (CYMBALTA) 60 MG capsule Take 1 capsule (60 mg total) by mouth daily after breakfast. 30 capsule 5   No current facility-administered medications for this visit.    Medication Side Effects: None  Allergies: No Known Allergies  Past Medical History:  Diagnosis Date   ADHD    Anal pain    Condyloma    Depression    Depression    GAD (generalized anxiety disorder)    Rectal mass    Wears glasses     Past Medical History, Surgical history, Social history, and Family history were reviewed and updated as appropriate.   Please see review of systems for further details on the patient's review from today.   Objective:   Physical Exam:  There were no vitals taken for this visit.  Physical Exam Constitutional:      General: He is not in acute distress. Musculoskeletal:        General: No deformity.  Neurological:     Mental Status: He is alert and oriented to person, place, and time.     Coordination: Coordination normal.  Psychiatric:        Attention and Perception: Attention and perception normal. He does not perceive auditory  or visual hallucinations.        Mood and Affect: Mood normal. Mood is not anxious or depressed. Affect is not labile, blunt, angry or inappropriate.        Speech: Speech normal.        Behavior: Behavior normal.        Thought Content: Thought content normal. Thought content is not paranoid or delusional. Thought content does not include homicidal or suicidal ideation. Thought content does not include homicidal or suicidal plan.        Cognition and Memory: Cognition and memory normal.        Judgment:  Judgment normal.     Comments: Insight intact    Lab Review:     Component Value Date/Time   NA 140 10/21/2019 0854   K 4.3 10/21/2019 0854   CL 102 10/21/2019 0854   CO2 31 10/21/2019 0854   GLUCOSE 86 10/21/2019 0854   BUN 10 10/21/2019 0854   CREATININE 0.76 10/21/2019 0854   CALCIUM 9.5 10/21/2019 0854   PROT 6.9 10/21/2019 0854   AST 22 10/21/2019 0854   ALT 16 10/21/2019 0854   BILITOT 0.6 10/21/2019 0854       Component Value Date/Time   WBC 5.9 10/21/2019 0854   RBC 4.88 10/21/2019 0854   HGB 15.7 10/21/2019 0854   HCT 46.1 10/21/2019 0854   PLT 258 10/21/2019 0854   MCV 94.5 10/21/2019 0854   MCH 32.2 10/21/2019 0854   MCHC 34.1 10/21/2019 0854   RDW 11.8 10/21/2019 0854   LYMPHSABS 2,018 10/21/2019 0854   EOSABS 18 10/21/2019 0854   BASOSABS 18 10/21/2019 0854    No results found for: POCLITH, LITHIUM   No results found for: PHENYTOIN, PHENOBARB, VALPROATE, CBMZ   .res Assessment: Plan:     Plan:  PDMP reviewed  1. Cymbalta 60mg  daily  2. Adderall 30mg  - take one and 1/2 tablets  118/57/89  Read and reviewed note with patient for accuracy.   RTC 3 months  Patient advised to contact office with any questions, adverse effects, or acute worsening in signs and symptoms.  Discussed potential benefits, risks, and side effects of stimulants with patient to include increased heart rate, palpitations, insomnia, increased anxiety, increased irritability, or decreased appetite.  Instructed patient to contact office if experiencing any significant tolerability issues.  Diagnoses and all orders for this visit:  Social anxiety disorder -     DULoxetine (CYMBALTA) 60 MG capsule; Take 1 capsule (60 mg total) by mouth daily after breakfast.  Moderate recurrent major depression (HCC) -     DULoxetine (CYMBALTA) 60 MG capsule; Take 1 capsule (60 mg total) by mouth daily after breakfast.  Attention deficit hyperactivity disorder (ADHD), inattentive type,  moderate -     amphetamine-dextroamphetamine (ADDERALL) 30 MG tablet; Take one tab total 30 mg every morning and 1/2 tab total 15 mg every 3 PM. -     amphetamine-dextroamphetamine (ADDERALL) 30 MG tablet; Take one tablet (30mg ) every morning and 1/2 tab total (15mg ) every day at 3pm. -     amphetamine-dextroamphetamine (ADDERALL) 30 MG tablet; Take one tablet (30mg ) every morning and 1/2 tab total (15mg ) every day at 3pm.    Please see After Visit Summary for patient specific instructions.  No future appointments.  No orders of the defined types were placed in this encounter.   -------------------------------

## 2021-01-18 ENCOUNTER — Ambulatory Visit (INDEPENDENT_AMBULATORY_CARE_PROVIDER_SITE_OTHER): Payer: 59 | Admitting: Adult Health

## 2021-01-18 ENCOUNTER — Other Ambulatory Visit: Payer: Self-pay

## 2021-01-18 ENCOUNTER — Encounter: Payer: Self-pay | Admitting: Adult Health

## 2021-01-18 DIAGNOSIS — F902 Attention-deficit hyperactivity disorder, combined type: Secondary | ICD-10-CM | POA: Diagnosis not present

## 2021-01-18 DIAGNOSIS — F4010 Social phobia, unspecified: Secondary | ICD-10-CM | POA: Diagnosis not present

## 2021-01-18 MED ORDER — AMPHETAMINE-DEXTROAMPHETAMINE 30 MG PO TABS: ORAL_TABLET | ORAL | 0 refills | Status: DC

## 2021-01-18 MED ORDER — DULOXETINE HCL 60 MG PO CPEP
60.0000 mg | ORAL_CAPSULE | Freq: Every day | ORAL | 5 refills | Status: DC
Start: 2021-01-18 — End: 2021-06-20

## 2021-01-18 NOTE — Progress Notes (Signed)
Lucas Herrera [id number] 23-Sep-1985 35 y.o.  Subjective:   Patient ID:  Lucas Herrera is a 35 y.o. (DOB May 12, 1985) male.  Chief Complaint: No chief complaint on file.   HPI Lucas Herrera presents to the office today for follow-up of ADHD and SAD.  Describes mood today as "ok". Pleasant. Tearful at times. Mood symptoms - reports decreased depression, anxiety, and irritability - "the normal amount". Denies anxiety attacks. Continues to grieve loss of father. He and family doing well. Seeing therapist with [name]. Stable interest and motivation. Taking medications as prescribed.  Energy levels stable. Active, has a regular exercise routine - 2 to 3 times a week.  Enjoys some usual interests and activities. Married. Lives with wife and 2 children. Spending time with family. Appetite adequate. Weight stable - 205 pounds. Sleeps well most nights. Averages 7 to 8 hours. Focus and concentration stable. Completing tasks. 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  Denies AH or VH.  Previous medication trials: Unknown   Oceanographer Row Office Visit from 04/19/2020 in [facility] at [facility] from 10/21/2019 in [facility] Total Score 2 3  PHQ-9 Total Score 15 12      Flowsheet Row Admission (Discharged) from 07/06/2020 in [facility]  C-SSRS RISK CATEGORY No Risk        Review of Systems:  Review of Systems  Musculoskeletal:  Negative for gait problem.  Neurological:  Negative for tremors.  Psychiatric/Behavioral:         Please refer to HPI   Medications: I have reviewed the patient's current medications.  Current Outpatient Medications  Medication Sig Dispense Refill   amphetamine-dextroamphetamine (ADDERALL) 30 MG tablet Take one tab total 30 mg every morning and 1/2 tab total 15 mg every 3 PM. 45 tablet 0   [START ON 02/15/2021] amphetamine-dextroamphetamine  (ADDERALL) 30 MG tablet Take one tablet (30mg ) every morning and 1/2 tab total (15mg ) every day at 3pm. 45 tablet 0   [START ON 03/15/2021] amphetamine-dextroamphetamine (ADDERALL) 30 MG tablet Take one tablet (30mg ) every morning and 1/2 tab total (15mg ) every day at 3pm. 45 tablet 0   DULoxetine (CYMBALTA) 60 MG capsule Take 1 capsule (60 mg total) by mouth daily after breakfast. 30 capsule 5   No current facility-administered medications for this visit.    Medication Side Effects: None  Allergies: No Known Allergies  Past Medical History:  Diagnosis Date   ADHD    Anal pain    Condyloma    Depression    Depression    GAD (generalized anxiety disorder)    Rectal mass    Wears glasses     Past Medical History, Surgical history, Social history, and Family history were reviewed and updated as appropriate.   Please see review of systems for further details on the patient's review from today.   Objective:   Physical Exam:  There were no vitals taken for this visit.  Physical Exam Constitutional:      General: He is not in acute distress. Musculoskeletal:        General: No deformity.  Neurological:     Mental Status: He is alert and oriented to person, place, and time.     Coordination: Coordination normal.  Psychiatric:        Attention and Perception: Attention and perception normal. He does not perceive auditory or visual hallucinations.  Mood and Affect: Mood normal. Mood is not anxious or depressed. Affect is not labile, blunt, angry or inappropriate.        Speech: Speech normal.        Behavior: Behavior normal.        Thought Content: Thought content normal. Thought content is not paranoid or delusional. Thought content does not include homicidal or suicidal ideation. Thought content does not include homicidal or suicidal plan.        Cognition and Memory: Cognition and memory normal.        Judgment: Judgment normal.     Comments: Insight intact    Lab  Review:     Component Value Date/Time   NA 140 10/21/2019 0854   K 4.3 10/21/2019 0854   CL 102 10/21/2019 0854   CO2 31 10/21/2019 0854   GLUCOSE 86 10/21/2019 0854   BUN 10 10/21/2019 0854   CREATININE 0.76 10/21/2019 0854   CALCIUM 9.5 10/21/2019 0854   PROT 6.9 10/21/2019 0854   AST 22 10/21/2019 0854   ALT 16 10/21/2019 0854   BILITOT 0.6 10/21/2019 0854       Component Value Date/Time   WBC 5.9 10/21/2019 0854   RBC 4.88 10/21/2019 0854   HGB 15.7 10/21/2019 0854   HCT 46.1 10/21/2019 0854   PLT 258 10/21/2019 0854   MCV 94.5 10/21/2019 0854   MCH 32.2 10/21/2019 0854   MCHC 34.1 10/21/2019 0854   RDW 11.8 10/21/2019 0854   LYMPHSABS 2,018 10/21/2019 0854   EOSABS 18 10/21/2019 0854   BASOSABS 18 10/21/2019 0854    No results found for: POCLITH, LITHIUM   No results found for: PHENYTOIN, PHENOBARB, VALPROATE, CBMZ   .res Assessment: Plan:    Plan:  PDMP reviewed  1. Cymbalta 60mg  daily  2. Adderall 30mg  - take one and 1/2 tablets  105/68/84  Read and reviewed note with patient for accuracy.   RTC 6 months  Patient advised to contact office with any questions, adverse effects, or acute worsening in signs and symptoms.  Discussed potential benefits, risks, and side effects of stimulants with patient to include increased heart rate, palpitations, insomnia, increased anxiety, increased irritability, or decreased appetite.  Instructed patient to contact office if experiencing any significant tolerability issues.  Diagnoses and all orders for this visit:  Attention deficit hyperactivity disorder (ADHD), combined type -     amphetamine-dextroamphetamine (ADDERALL) 30 MG tablet; Take one tab total 30 mg every morning and 1/2 tab total 15 mg every 3 PM. -     amphetamine-dextroamphetamine (ADDERALL) 30 MG tablet; Take one tablet (30mg ) every morning and 1/2 tab total (15mg ) every day at 3pm. -     amphetamine-dextroamphetamine (ADDERALL) 30 MG tablet; Take  one tablet (30mg ) every morning and 1/2 tab total (15mg ) every day at 3pm.  Social anxiety disorder -     DULoxetine (CYMBALTA) 60 MG capsule; Take 1 capsule (60 mg total) by mouth daily after breakfast.    Please see After Visit Summary for patient specific instructions.  Future Appointments  Date Time Provider Department Center  06/20/2021  8:00 AM Jibril Mcminn, , NP [facility] None    No orders of the defined types were placed in this encounter.   -------------------------------

## 2021-06-20 ENCOUNTER — Ambulatory Visit (INDEPENDENT_AMBULATORY_CARE_PROVIDER_SITE_OTHER): Payer: 59 | Admitting: Adult Health

## 2021-06-20 ENCOUNTER — Encounter: Payer: Self-pay | Admitting: Adult Health

## 2021-06-20 DIAGNOSIS — F902 Attention-deficit hyperactivity disorder, combined type: Secondary | ICD-10-CM | POA: Diagnosis not present

## 2021-06-20 DIAGNOSIS — F4010 Social phobia, unspecified: Secondary | ICD-10-CM

## 2021-06-20 MED ORDER — AMPHETAMINE-DEXTROAMPHETAMINE 30 MG PO TABS: ORAL_TABLET | ORAL | 0 refills | Status: DC

## 2021-06-20 MED ORDER — DULOXETINE HCL 60 MG PO CPEP: 60.0000 mg | ORAL_CAPSULE | Freq: Every day | ORAL | 5 refills | Status: DC

## 2021-06-20 NOTE — Progress Notes (Signed)
Aran Cowens ?[id number] ?06-06-1985 ?36 y.o. ? ?Subjective:  ? ?Patient ID:  Lucas Herrera is a 36 y.o. (DOB 05-Oct-1985) male. ? ?Chief Complaint: No chief complaint on file. ? ? ?HPI ?Lucas Herrera presents to the office today for follow-up of ADHD and SAD. ? ?Describes mood today as "ok". Pleasant. Tearful at times. Mood symptoms - reports decreased depression, anxiety, and irritability - "the usual amount". Denies anxiety attacks - "intermittent". Stating "I feel like I'm doing ok". He and family doing well. Stable interest and motivation. Taking medications as prescribed.  ?Energy levels stable. Active, has a regular exercise routine - 2 to 3 times a week.  ?Enjoys some usual interests and activities. Married. Lives with wife and 2 children. Spending time with family. ?Appetite adequate. Weight stable - 205 pounds. ?Sleeps well most nights. Averages 7 to 8 hours. ?Focus and concentration stable. Completing tasks. 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  ?Denies AH or VH. ? ?Previous medication trials: Unknown ? ? ?PHQ2-9   ? ?[place] Office Visit from 04/19/2020 in [place] at [facility] from 10/21/2019 in [place] at [place]  ?PHQ-2 Total Score 2 3  ?PHQ-9 Total Score 15 12  ? ?  ? ?Flowsheet Row Admission (Discharged) from 07/06/2020 in [facility]  ?C-SSRS RISK CATEGORY No Risk  ? ?  ?  ? ?Review of Systems:  ?Review of Systems  ?Musculoskeletal:  Negative for gait problem.  ?Neurological:  Negative for tremors.  ?Psychiatric/Behavioral:    ?     Please refer to HPI  ? ?Medications: I have reviewed the patient's current medications. ? ?Current Outpatient Medications  ?Medication Sig Dispense Refill  ? amphetamine-dextroamphetamine (ADDERALL) 30 MG tablet Take one tab total 30 mg every morning and 1/2 tab total 15 mg every 3 PM. 45 tablet 0  ? amphetamine-dextroamphetamine (ADDERALL) 30 MG tablet Take one tablet (30mg )  every morning and 1/2 tab total (15mg ) every day at 3pm. 45 tablet 0  ? amphetamine-dextroamphetamine (ADDERALL) 30 MG tablet Take one tablet (30mg ) every morning and 1/2 tab total (15mg ) every day at 3pm. 45 tablet 0  ? DULoxetine (CYMBALTA) 60 MG capsule Take 1 capsule (60 mg total) by mouth daily after breakfast. 30 capsule 5  ? ?No current facility-administered medications for this visit.  ? ? ?Medication Side Effects: None ? ?Allergies: No Known Allergies ? ?Past Medical History:  ?Diagnosis Date  ? ADHD   ? Anal pain   ? Condyloma   ? Depression   ? Depression   ? GAD (generalized anxiety disorder)   ? Rectal mass   ? Wears glasses   ? ? ?Past Medical History, Surgical history, Social history, and Family history were reviewed and updated as appropriate.  ? ?Please see review of systems for further details on the patient's review from today.  ? ?Objective:  ? ?Physical Exam:  ?There were no vitals taken for this visit. ? ?Physical Exam ?Constitutional:   ?   General: He is not in acute distress. ?Musculoskeletal:     ?   General: No deformity.  ?Neurological:  ?   Mental Status: He is alert and oriented to person, place, and time.  ?   Coordination: Coordination normal.  ?Psychiatric:     ?   Attention and Perception: Attention and perception normal. He does not perceive auditory or visual hallucinations.     ?   Mood and Affect: Mood normal. Mood is not  anxious or depressed. Affect is not labile, blunt, angry or inappropriate.     ?   Speech: Speech normal.     ?   Behavior: Behavior normal.     ?   Thought Content: Thought content normal. Thought content is not paranoid or delusional. Thought content does not include homicidal or suicidal ideation. Thought content does not include homicidal or suicidal plan.     ?   Cognition and Memory: Cognition and memory normal.     ?   Judgment: Judgment normal.  ?   Comments: Insight intact  ? ? ?Lab Review:  ?   ?Component Value Date/Time  ? NA 140 10/21/2019 0854  ? K  4.3 10/21/2019 0854  ? CL 102 10/21/2019 0854  ? CO2 31 10/21/2019 0854  ? GLUCOSE 86 10/21/2019 0854  ? BUN 10 10/21/2019 0854  ? CREATININE 0.76 10/21/2019 0854  ? CALCIUM 9.5 10/21/2019 0854  ? PROT 6.9 10/21/2019 0854  ? AST 22 10/21/2019 0854  ? ALT 16 10/21/2019 0854  ? BILITOT 0.6 10/21/2019 0854  ? ? ?   ?Component Value Date/Time  ? WBC 5.9 10/21/2019 0854  ? RBC 4.88 10/21/2019 0854  ? HGB 15.7 10/21/2019 0854  ? HCT 46.1 10/21/2019 0854  ? PLT 258 10/21/2019 0854  ? MCV 94.5 10/21/2019 0854  ? MCH 32.2 10/21/2019 0854  ? MCHC 34.1 10/21/2019 0854  ? RDW 11.8 10/21/2019 0854  ? LYMPHSABS 2,018 10/21/2019 0854  ? EOSABS 18 10/21/2019 0854  ? BASOSABS 18 10/21/2019 0854  ? ? ?No results found for: POCLITH, LITHIUM  ? ?No results found for: PHENYTOIN, PHENOBARB, VALPROATE, CBMZ  ? ?.res ?Assessment: Plan:   ? ?Plan: ? ?PDMP reviewed ? ?1. Cymbalta 60mg  daily  ?2. Adderall 30mg  - take one and 1/2 tablets ? ?98/61/66 ? ?RTC 6 months - will call in 3 months for next set of scripts. ? ?Patient advised to contact office with any questions, adverse effects, or acute worsening in signs and symptoms. ? ?Discussed potential benefits, risks, and side effects of stimulants with patient to include increased heart rate, palpitations, insomnia, increased anxiety, increased irritability, or decreased appetite.  Instructed patient to contact office if experiencing any significant tolerability issues. ? ?There are no diagnoses linked to this encounter.  ? ?Please see After Visit Summary for patient specific instructions. ? ?Future Appointments  ?Date Time Provider [place]  ?06/20/2021  8:00 AM Lynsay Fesperman, Berdie Ogren, NP [facility] None  ? ? ?No orders of the defined types were placed in this encounter. ? ? ?------------------------------- ?

## 2021-08-23 ENCOUNTER — Ambulatory Visit (INDEPENDENT_AMBULATORY_CARE_PROVIDER_SITE_OTHER): Payer: 59 | Admitting: Family Medicine

## 2021-08-23 ENCOUNTER — Encounter: Payer: Self-pay | Admitting: Family Medicine

## 2021-08-23 VITALS — BP 130/86 | HR 70 | Temp 97.6°F | Ht 75.0 in | Wt 191.0 lb

## 2021-08-23 DIAGNOSIS — F4010 Social phobia, unspecified: Secondary | ICD-10-CM | POA: Diagnosis not present

## 2021-08-23 DIAGNOSIS — Z7689 Persons encountering health services in other specified circumstances: Secondary | ICD-10-CM | POA: Diagnosis not present

## 2021-08-23 DIAGNOSIS — E559 Vitamin D deficiency, unspecified: Secondary | ICD-10-CM | POA: Diagnosis not present

## 2021-08-23 DIAGNOSIS — A630 Anogenital (venereal) warts: Secondary | ICD-10-CM

## 2021-08-23 NOTE — Patient Instructions (Signed)
It was a pleasure meeting you today.  I placed the referral to [facility] and you should hear from them in the next few days.  Let me know if you do not.  Follow-up at your convenience and we can do a complete physical exam with blood work.

## 2021-08-23 NOTE — Assessment & Plan Note (Signed)
Recommend CPE at his convenience.

## 2021-08-23 NOTE — Assessment & Plan Note (Signed)
Recommend starting on an OTC supplement. We will check labs including vitamin D level at his CPE

## 2021-08-23 NOTE — Assessment & Plan Note (Signed)
Reports seeing urologist in the past 2 years for this and was started on medication. Referral made per patient request to urologist.

## 2021-08-23 NOTE — Progress Notes (Signed)
New Patient Office Visit  Subjective    Patient ID: Lucas Herrera, male    DOB: 09/29/1985  Age: 36 y.o. MRN: [id number]  CC:  Chief Complaint  Patient presents with   Establish Care    HPV, needs referral back to urologist so that he can get his medication     HPI Lucas Herrera presents to establish care  Previous medical care: 2 years ago.   HPV viral warts penis and anal.   Requests referral back to urology.  He saw them in the past for HPV infection and was prescribed medication by them.  Now that he has insurance again he would like to start treatment.  Denies fever, chills, dizziness, chest pain, palpitations, shortness of breath, abdominal pain, N/V/D, urinary symptoms.    Under the care of [facility] for ADHD and social anxiety disorder.        08/23/2021    8:05 AM 04/19/2020    7:58 AM 10/21/2019    8:10 AM  Depression screen PHQ 2/9  Decreased Interest 3 1 1   Down, Depressed, Hopeless 3 1 2   PHQ - 2 Score 6 2 3   Altered sleeping 3 0 2  Tired, decreased energy 3 3 3   Change in appetite 3 2 0  Feeling bad or failure about yourself  3 3 2   Trouble concentrating 3 3 2   Moving slowly or fidgety/restless 0 2 0  Suicidal thoughts 0 0 0  PHQ-9 Score 21 15 12   Difficult doing work/chores Very difficult Very difficult Not difficult at all     Outpatient Encounter Medications as of 08/23/2021  Medication Sig   amphetamine-dextroamphetamine (ADDERALL) 30 MG tablet Take one tab total 30 mg every morning and 1/2 tab total 15 mg every 3 PM.   amphetamine-dextroamphetamine (ADDERALL) 30 MG tablet Take one tablet (30mg ) every morning and 1/2 tab total (15mg ) every day at 3pm.   amphetamine-dextroamphetamine (ADDERALL) 30 MG tablet Take one tablet (30mg ) every morning and 1/2 tab total (15mg ) every day at 3pm.   DULoxetine (CYMBALTA) 60 MG capsule Take 1 capsule (60 mg total) by mouth daily after breakfast.   No facility-administered encounter  medications on file as of 08/23/2021.    Past Medical History:  Diagnosis Date   ADHD    Anal pain    Condyloma    Depression    Depression    GAD (generalized anxiety disorder)    Rectal mass    Wears glasses     Past Surgical History:  Procedure Laterality Date   EVALUATION UNDER ANESTHESIA WITH FISTULECTOMY N/A 02/09/2016   Procedure: EXAM UNDER ANESTHESIA with EXCISIONAL BIOPSY OF ANAL CANAL MASS;  Surgeon: , MD;  Location: [facility];  Service: General;  Laterality: N/A;   INCISION AND DRAINAGE ABSCESS N/A 07/06/2020   Procedure: INCISION AND DRAINAGE of perianal wound;  Surgeon: , MD;  Location: [facility];  Service: General;  Laterality: N/A;   RECTAL EXAM UNDER ANESTHESIA N/A 07/06/2020   Procedure: ANORECTAL EXAM UNDER ANESTHESIA;  Surgeon: , MD;  Location: [facility];  Service: General;  Laterality: N/A;   SIGMOIDOSCOPY  01/2015   SPHINCTEROTOMY N/A 02/09/2016   Procedure: CHEMICAL SPHINCTEROTOMY;  Surgeon: 08/25/2021, MD;  Location: [facility]  Service: General;  Laterality: N/A;   WART FULGURATION N/A 07/06/2020   Procedure: EXCISION OF PERIANAL LESIONS and fulguration;  Surgeon: [facility], MD;  Location:  [facility];  Service: General;  Laterality: N/A;    Family History  Problem Relation Age of Onset   CAD Mother    Suicidality Father     Social History   Socioeconomic History   Marital status: Married    Spouse name: Not on file   Number of children: Not on file   Years of education: Not on file   Highest education level: Not on file  Occupational History   Not on file  Tobacco Use   Smoking status: Never   Smokeless tobacco: Never  Vaping Use   Vaping Use: Never used  Substance and Sexual Activity   Alcohol use: Yes    Alcohol/week: 3.0 standard drinks of alcohol    Types: 3 Cans of beer per week     Comment: occasional 3 4 PER WEEK   Drug use: Yes    Types: Marijuana    Comment: 2-3 x a day USES DAILY AS OF 07-03-2020   Sexual activity: Not on file  Other Topics Concern   Not on file  Social History Narrative   Not on file   Social Determinants of Health   Financial Resource Strain: Not on file  Food Insecurity: Not on file  Transportation Needs: Not on file  Physical Activity: Not on file  Stress: Not on file  Social Connections: Not on file  Intimate Partner Violence: Not on file    ROS      Objective    BP 130/86 (BP Location: Left Arm, Patient Position: Sitting, Cuff Size: Large)   Pulse 70   Temp 97.6 F (36.4 C) (Temporal)   Ht 6\' 3"  (1.905 m)   Wt 191 lb (86.6 kg)   SpO2 98%   BMI 23.87 kg/m   Physical Exam Constitutional:      General: He is not in acute distress.    Appearance: He is not ill-appearing.  Cardiovascular:     Rate and Rhythm: Normal rate.  Pulmonary:     Effort: Pulmonary effort is normal.  Neurological:     General: No focal deficit present.     Mental Status: He is alert and oriented to person, place, and time.  Psychiatric:        Mood and Affect: Mood normal.        Behavior: Behavior normal.        Thought Content: Thought content normal.          Assessment & Plan:   Problem List Items Addressed This Visit       Other   Anogenital HPV infection - Primary    Reports seeing urologist in the past 2 years for this and was started on medication. Referral made per patient request to urologist.       Relevant Orders   Ambulatory referral to Urology   Encounter to establish care    Recommend CPE at his convenience.       Social anxiety disorder    Under the care of [facility]      Vitamin D deficiency    Recommend starting on an OTC supplement. We will check labs including vitamin D level at his CPE       Return if symptoms worsen or fail to improve.   , NP-C

## 2021-08-23 NOTE — Assessment & Plan Note (Signed)
Under the care of [facility]

## 2021-11-16 ENCOUNTER — Telehealth: Payer: Self-pay | Admitting: Adult Health

## 2021-11-16 ENCOUNTER — Other Ambulatory Visit: Payer: Self-pay

## 2021-11-16 DIAGNOSIS — F902 Attention-deficit hyperactivity disorder, combined type: Secondary | ICD-10-CM

## 2021-11-16 MED ORDER — AMPHETAMINE-DEXTROAMPHETAMINE 30 MG PO TABS: ORAL_TABLET | ORAL | 0 refills | Status: DC

## 2021-11-16 NOTE — Telephone Encounter (Signed)
Pended.

## 2021-11-16 NOTE — Telephone Encounter (Signed)
Next appt is 12/19/21. Requesting refill on Adderall called to:  [facility] [phone] - [place], [place] - [address] AT [address]  Phone:  [phone]  Fax:  [phone]

## 2021-12-19 ENCOUNTER — Encounter: Payer: Self-pay | Admitting: Adult Health

## 2021-12-19 ENCOUNTER — Ambulatory Visit (INDEPENDENT_AMBULATORY_CARE_PROVIDER_SITE_OTHER): Payer: Commercial Managed Care - HMO | Admitting: Adult Health

## 2021-12-19 DIAGNOSIS — F902 Attention-deficit hyperactivity disorder, combined type: Secondary | ICD-10-CM | POA: Diagnosis not present

## 2021-12-19 DIAGNOSIS — F4010 Social phobia, unspecified: Secondary | ICD-10-CM

## 2021-12-19 MED ORDER — AMPHETAMINE-DEXTROAMPHETAMINE 30 MG PO TABS: ORAL_TABLET | ORAL | 0 refills | Status: DC

## 2021-12-19 MED ORDER — DULOXETINE HCL 60 MG PO CPEP: 60.0000 mg | ORAL_CAPSULE | Freq: Every day | ORAL | 5 refills | Status: DC

## 2021-12-19 NOTE — Progress Notes (Signed)
Mccauley Diehl [id number] 1985-07-21 36 y.o.  Subjective:   Patient ID:  Lucas Herrera is a 36 y.o. (DOB 1986/01/12) male.  Chief Complaint: No chief complaint on file.   HPI Lucas Herrera presents to the office today for follow-up of ADHD and SAD.  Describes mood today as "ok". Pleasant. Tearful at times. Mood symptoms - reports decreased depression, anxiety, and irritability - "nothing I haven't been able to manage". Denies anxiety attacks. Mood is consistent". Stating "I'm doing alright". Feels like medications are working well. Family doing well. Stable interest and motivation. Taking medications as prescribed.  Energy levels stable. Active, has a regular exercise routine - 2 to 3 times a week.  Enjoys some usual interests and activities. Married. Lives with wife and 2 children - 9. Spending time with family. Appetite adequate. Weight stable - 205 pounds - 75". Sleeps well most nights. Averages 6 to 7 hours. Focus and concentration stable. Completing tasks. 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  Denies AH or VH.  Previous medication trials: Unknown   PHQ2-9    Flowsheet Row Office Visit from 08/23/2021 in [facility] at [facility] Visit from 04/19/2020 in [facility] at [facility] from 10/21/2019 in [facility] at [facility] Total Score 6 2 3   PHQ-9 Total Score 21 15 12       Flowsheet Row Admission (Discharged) from 07/06/2020 in [facility]  C-SSRS RISK CATEGORY No Risk        Review of Systems:  Review of Systems  Musculoskeletal:  Negative for gait problem.  Neurological:  Negative for tremors.  Psychiatric/Behavioral:         Please refer to HPI    Medications: I have reviewed the patient's current medications.  Current Outpatient Medications  Medication Sig Dispense Refill   amphetamine-dextroamphetamine (ADDERALL) 30 MG tablet Take one tablet (30mg ) every  morning and 1/2 tab total (15mg ) every day at 3pm. 45 tablet 0   amphetamine-dextroamphetamine (ADDERALL) 30 MG tablet Take one tablet (30mg ) every morning and 1/2 tab total (15mg ) every day at 3pm. 45 tablet 0   amphetamine-dextroamphetamine (ADDERALL) 30 MG tablet Take one tab total 30 mg every morning and 1/2 tab total 15 mg every 3 PM. 45 tablet 0   DULoxetine (CYMBALTA) 60 MG capsule Take 1 capsule (60 mg total) by mouth daily after breakfast. 30 capsule 5   No current facility-administered medications for this visit.    Medication Side Effects: None  Allergies: No Known Allergies  Past Medical History:  Diagnosis Date   ADHD    Anal pain    Condyloma    Depression    Depression    GAD (generalized anxiety disorder)    Rectal mass    Wears glasses     Past Medical History, Surgical history, Social history, and Family history were reviewed and updated as appropriate.   Please see review of systems for further details on the patient's review from today.   Objective:   Physical Exam:  There were no vitals taken for this visit.  Physical Exam Constitutional:      General: He is not in acute distress. Musculoskeletal:        General: No deformity.  Neurological:     Mental Status: He is alert and oriented to person, place, and time.     Coordination: Coordination normal.  Psychiatric:        Attention and Perception: Attention and perception normal.  He does not perceive auditory or visual hallucinations.        Mood and Affect: Mood normal. Mood is not anxious or depressed. Affect is not labile, blunt, angry or inappropriate.        Speech: Speech normal.        Behavior: Behavior normal.        Thought Content: Thought content normal. Thought content is not paranoid or delusional. Thought content does not include homicidal or suicidal ideation. Thought content does not include homicidal or suicidal plan.        Cognition and Memory: Cognition and memory normal.         Judgment: Judgment normal.     Comments: Insight intact     Lab Review:     Component Value Date/Time   NA 140 10/21/2019 0854   K 4.3 10/21/2019 0854   CL 102 10/21/2019 0854   CO2 31 10/21/2019 0854   GLUCOSE 86 10/21/2019 0854   BUN 10 10/21/2019 0854   CREATININE 0.76 10/21/2019 0854   CALCIUM 9.5 10/21/2019 0854   PROT 6.9 10/21/2019 0854   AST 22 10/21/2019 0854   ALT 16 10/21/2019 0854   BILITOT 0.6 10/21/2019 0854       Component Value Date/Time   WBC 5.9 10/21/2019 0854   RBC 4.88 10/21/2019 0854   HGB 15.7 10/21/2019 0854   HCT 46.1 10/21/2019 0854   PLT 258 10/21/2019 0854   MCV 94.5 10/21/2019 0854   MCH 32.2 10/21/2019 0854   MCHC 34.1 10/21/2019 0854   RDW 11.8 10/21/2019 0854   LYMPHSABS 2,018 10/21/2019 0854   EOSABS 18 10/21/2019 0854   BASOSABS 18 10/21/2019 0854    No results found for: "POCLITH", "LITHIUM"   No results found for: "PHENYTOIN", "PHENOBARB", "VALPROATE", "CBMZ"   .res Assessment: Plan:    Plan:  PDMP reviewed  1. Cymbalta 60mg  daily  2. Adderall 30mg  - take one and 1/2 tablets  Monitor BP between visits while taking stimulant medication.   104/6494  RTC 6 months - will call in 3 months for next set of scripts.  Patient advised to contact office with any questions, adverse effects, or acute worsening in signs and symptoms.  Time spent with patient was 15 minutes. Greater than 50% of face to face time with patient was spent on counseling and coordination of care.    Discussed potential benefits, risks, and side effects of stimulants with patient to include increased heart rate, palpitations, insomnia, increased anxiety, increased irritability, or decreased appetite.  Instructed patient to contact office if experiencing any significant tolerability issues.  Diagnoses and all orders for this visit:  Attention deficit hyperactivity disorder (ADHD), combined type  Social anxiety disorder     Please see After Visit  Summary for patient specific instructions.  Future Appointments  Date Time Provider Department Center  12/19/2021  8:20 AM Ziyah Cordoba, [id number], NP [facility] None    No orders of the defined types were placed in this encounter.   -------------------------------

## 2022-04-08 ENCOUNTER — Telehealth: Payer: Self-pay | Admitting: Family Medicine

## 2022-04-08 DIAGNOSIS — A630 Anogenital (venereal) warts: Secondary | ICD-10-CM

## 2022-04-08 NOTE — Telephone Encounter (Signed)
Patient called states a referral was put in for a urologist a while ago but insurance did not cover the appointment, he has now found a urologist, Sabino Gasser with [facility], and would like a referral sent to that office instead.

## 2022-04-09 NOTE — Telephone Encounter (Signed)
New referral placed.

## 2022-05-22 ENCOUNTER — Telehealth: Payer: Self-pay | Admitting: Adult Health

## 2022-05-22 ENCOUNTER — Other Ambulatory Visit: Payer: Self-pay | Admitting: Adult Health

## 2022-05-22 DIAGNOSIS — F902 Attention-deficit hyperactivity disorder, combined type: Secondary | ICD-10-CM

## 2022-05-22 MED ORDER — AMPHETAMINE-DEXTROAMPHETAMINE 30 MG PO TABS: ORAL_TABLET | ORAL | 0 refills | Status: DC

## 2022-05-22 NOTE — Telephone Encounter (Signed)
Script sent  

## 2022-05-22 NOTE — Telephone Encounter (Signed)
Pt is requesting RF Adderall 30mg . The [facility] on [address] is closed now. SO he will need it sent to a different one: [facility] [address] Boneta Lucks 5/8

## 2022-06-19 ENCOUNTER — Ambulatory Visit (INDEPENDENT_AMBULATORY_CARE_PROVIDER_SITE_OTHER): Payer: Commercial Managed Care - HMO | Admitting: Adult Health

## 2022-06-19 ENCOUNTER — Encounter: Payer: Self-pay | Admitting: Adult Health

## 2022-06-19 DIAGNOSIS — F902 Attention-deficit hyperactivity disorder, combined type: Secondary | ICD-10-CM

## 2022-06-19 DIAGNOSIS — F4010 Social phobia, unspecified: Secondary | ICD-10-CM

## 2022-06-19 MED ORDER — AMPHETAMINE-DEXTROAMPHETAMINE 30 MG PO TABS: ORAL_TABLET | ORAL | 0 refills | Status: DC

## 2022-06-19 MED ORDER — DULOXETINE HCL 60 MG PO CPEP: 60.0000 mg | ORAL_CAPSULE | Freq: Every day | ORAL | 5 refills | Status: DC

## 2022-06-19 NOTE — Progress Notes (Signed)
Lucas Herrera [id number] 10-06-1985 37 y.o.  Subjective:   Patient ID:  Lucas Herrera is a 37 y.o. (DOB 1986/01/31) male.  Chief Complaint: No chief complaint on file.   HPI Lucas Herrera presents to the office today for follow-up of ADHD and SAD.  Describes mood today as "ok". Pleasant. Tearful at times. Mood symptoms - denies depression, anxiety, and irritability. Denies anxiety attacks. Mood is consistent. Stating "I feel like I'm doing better". Feels like medications work well. Family doing well. Stable interest and motivation. Taking medications as prescribed.  Energy levels stable. Active, does not have a regular exercise routine. Enjoys some usual interests and activities. Married. Lives with wife and 2 children. Spending time with family. Appetite adequate. Weight stable - 205 pounds - 75". Sleeps well most nights. Averages 6 to 7 hours. Focus and concentration stable. Completing tasks. 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  Denies AH or VH. Denies self harm. Denies substance use.  Previous medication trials: Unknown   PHQ2-9    Flowsheet Row Office Visit from 08/23/2021 in [facility] Visit from 04/19/2020 in [facility] [place] Office Visit from 10/21/2019 in [facility] [place]  PHQ-2 Total Score 6 2 3   PHQ-9 Total Score 21 15 12       Flowsheet Row Admission (Discharged) from 07/06/2020 in [facility]  C-SSRS RISK CATEGORY No Risk        Review of Systems:  Review of Systems  Musculoskeletal:  Negative for gait problem.  Neurological:  Negative for tremors.  Psychiatric/Behavioral:         Please refer to HPI    Medications: I have reviewed the patient's current medications.  Current Outpatient Medications  Medication Sig Dispense Refill   amphetamine-dextroamphetamine (ADDERALL) 30 MG tablet Take one tablet (30mg )  every morning and 1/2 tab total (15mg ) every day at 3pm. 45 tablet 0   amphetamine-dextroamphetamine (ADDERALL) 30 MG tablet Take one tablet (30mg ) every morning and 1/2 tab total (15mg ) every day at 3pm. 45 tablet 0   amphetamine-dextroamphetamine (ADDERALL) 30 MG tablet Take one tab total 30 mg every morning and 1/2 tab total 15 mg every 3 PM. 45 tablet 0   DULoxetine (CYMBALTA) 60 MG capsule Take 1 capsule (60 mg total) by mouth daily after breakfast. 30 capsule 5   No current facility-administered medications for this visit.    Medication Side Effects: None  Allergies: No Known Allergies  Past Medical History:  Diagnosis Date   ADHD    Anal pain    Condyloma    Depression    Depression    GAD (generalized anxiety disorder)    Rectal mass    Wears glasses     Past Medical History, Surgical history, Social history, and Family history were reviewed and updated as appropriate.   Please see review of systems for further details on the patient's review from today.   Objective:   Physical Exam:  There were no vitals taken for this visit.  Physical Exam Constitutional:      General: He is not in acute distress. Musculoskeletal:        General: No deformity.  Neurological:     Mental Status: He is alert and oriented to person, place, and time.     Coordination: Coordination normal.  Psychiatric:        Attention and Perception: Attention and perception normal. He does not  perceive auditory or visual hallucinations.        Mood and Affect: Mood normal. Mood is not anxious or depressed. Affect is not labile, blunt, angry or inappropriate.        Speech: Speech normal.        Behavior: Behavior normal.        Thought Content: Thought content normal. Thought content is not paranoid or delusional. Thought content does not include homicidal or suicidal ideation. Thought content does not include homicidal or suicidal plan.        Cognition and Memory: Cognition and memory normal.         Judgment: Judgment normal.     Comments: Insight intact     Lab Review:     Component Value Date/Time   NA 140 10/21/2019 0854   K 4.3 10/21/2019 0854   CL 102 10/21/2019 0854   CO2 31 10/21/2019 0854   GLUCOSE 86 10/21/2019 0854   BUN 10 10/21/2019 0854   CREATININE 0.76 10/21/2019 0854   CALCIUM 9.5 10/21/2019 0854   PROT 6.9 10/21/2019 0854   AST 22 10/21/2019 0854   ALT 16 10/21/2019 0854   BILITOT 0.6 10/21/2019 0854       Component Value Date/Time   WBC 5.9 10/21/2019 0854   RBC 4.88 10/21/2019 0854   HGB 15.7 10/21/2019 0854   HCT 46.1 10/21/2019 0854   PLT 258 10/21/2019 0854   MCV 94.5 10/21/2019 0854   MCH 32.2 10/21/2019 0854   MCHC 34.1 10/21/2019 0854   RDW 11.8 10/21/2019 0854   LYMPHSABS 2,018 10/21/2019 0854   EOSABS 18 10/21/2019 0854   BASOSABS 18 10/21/2019 0854    No results found for: "POCLITH", "LITHIUM"   No results found for: "PHENYTOIN", "PHENOBARB", "VALPROATE", "CBMZ"   .res Assessment: Plan:    Plan:  PDMP reviewed  1. Cymbalta 60mg  daily  2. Adderall 30mg  - take one and 1/2 tablets  Monitor BP between visits while taking stimulant medication.   [id number]/[id number]  RTC 6 months - will call in 3 months for next set of scripts.  Patient advised to contact office with any questions, adverse effects, or acute worsening in signs and symptoms.  Time spent with patient was 15 minutes. Greater than 50% of face to face time with patient was spent on counseling and coordination of care.    Discussed potential benefits, risks, and side effects of stimulants with patient to include increased heart rate, palpitations, insomnia, increased anxiety, increased irritability, or decreased appetite.  Instructed patient to contact office if experiencing any significant tolerability issues.  There are no diagnoses linked to this encounter.   Please see After Visit Summary for patient specific instructions.  Future Appointments  Date Time Provider  Department Center  06/19/2022  8:20 AM Lenox Ladouceur, Thereasa Solo, NP [facility] None    No orders of the defined types were placed in this encounter.   -------------------------------

## 2022-11-06 ENCOUNTER — Telehealth: Payer: Self-pay | Admitting: Adult Health

## 2022-11-06 NOTE — Telephone Encounter (Signed)
Lucas Herrera lvm requesting a refill on the Adderall. RTC inquiring which [facility] to send [facility] to.  Appointment 12/20/22 Contact # [phone]

## 2022-11-07 ENCOUNTER — Other Ambulatory Visit: Payer: Self-pay

## 2022-11-07 DIAGNOSIS — F902 Attention-deficit hyperactivity disorder, combined type: Secondary | ICD-10-CM

## 2022-11-07 NOTE — Telephone Encounter (Signed)
Reviewed and pended for [facility]

## 2022-11-07 NOTE — Telephone Encounter (Signed)
L/F 09/23/22;  Spoke with patient, he is requesting rx be sent to [facility] at [address] [place], [place] [address].  Spoke with Luisa Hart at the Pharmacy, they do have his strength in stock, however, only 50.

## 2022-11-07 NOTE — Telephone Encounter (Signed)
Please check pt's last refill on PMP. Contact pt to clarify which pharmacy he is requesting and if they have strength needed.

## 2022-11-08 MED ORDER — AMPHETAMINE-DEXTROAMPHETAMINE 30 MG PO TABS: ORAL_TABLET | ORAL | 0 refills | Status: DC

## 2022-12-20 ENCOUNTER — Ambulatory Visit (INDEPENDENT_AMBULATORY_CARE_PROVIDER_SITE_OTHER): Payer: Self-pay | Admitting: Adult Health

## 2022-12-20 ENCOUNTER — Encounter: Payer: Self-pay | Admitting: Adult Health

## 2022-12-20 DIAGNOSIS — F4010 Social phobia, unspecified: Secondary | ICD-10-CM

## 2022-12-20 DIAGNOSIS — F902 Attention-deficit hyperactivity disorder, combined type: Secondary | ICD-10-CM

## 2022-12-20 MED ORDER — AMPHETAMINE-DEXTROAMPHETAMINE 30 MG PO TABS: ORAL_TABLET | ORAL | 0 refills | Status: DC

## 2022-12-20 MED ORDER — DULOXETINE HCL 60 MG PO CPEP: 60.0000 mg | ORAL_CAPSULE | Freq: Every day | ORAL | 5 refills | Status: DC

## 2022-12-20 NOTE — Progress Notes (Signed)
Lucas Herrera [id number] April 09, 1985 37 y.o.  Subjective:   Patient ID:  Lucas Herrera is a 37 y.o. (DOB 07/14/1985) male.  Chief Complaint: No chief complaint on file.   HPI Lucas Herrera presents to the office today for follow-up of ADHD and SAD.  Describes mood today as "ok". Pleasant. Tearful at times. Mood symptoms - reports some situational depression, anxiety, and irritability. Denies anxiety/panic attacks. Reports some worry, rumination, and over thinking. Mood is consistent. Stating "I feel like I'm doing as good as I can be". Feels like medications work well. Family doing well. Stable interest and motivation. Taking medications as prescribed.  Energy levels stable. Active, does not have a regular exercise routine. Enjoys some usual interests and activities. Married. Lives with wife and 2 children. Spending time with family. Appetite adequate. Weight stable - 205 pounds - 75". Sleeps well most nights. Averages 6 to 7 hours. Focus and concentration stable. Completing tasks. 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  Denies AH or VH. Denies self harm. Denies substance use.  Previous medication trials: Unknown   PHQ2-9    Flowsheet Row Office Visit from 08/23/2021 in [facility] Visit from 04/19/2020 in [facility] [place] Office Visit from 10/21/2019 in [facility] [place]  PHQ-2 Total Score 6 2 3   PHQ-9 Total Score 21 15 12       Flowsheet Row Admission (Discharged) from 07/06/2020 in [facility]  C-SSRS RISK CATEGORY No Risk        Review of Systems:  Review of Systems  Musculoskeletal:  Negative for gait problem.  Neurological:  Negative for tremors.  Psychiatric/Behavioral:         Please refer to HPI    Medications: I have reviewed the patient's current medications.  Current Outpatient Medications  Medication Sig  Dispense Refill   amphetamine-dextroamphetamine (ADDERALL) 30 MG tablet Take one tablet (30mg ) every morning and 1/2 tab total (15mg ) every day at 3pm. 45 tablet 0   [START ON 01/17/2023] amphetamine-dextroamphetamine (ADDERALL) 30 MG tablet Take one tab total 30 mg every morning and 1/2 tab total 15 mg every 3 PM. 45 tablet 0   [START ON 02/14/2023] amphetamine-dextroamphetamine (ADDERALL) 30 MG tablet Take one tablet (30mg ) every morning and 1/2 tab total (15mg ) every day at 3pm. 45 tablet 0   DULoxetine (CYMBALTA) 60 MG capsule Take 1 capsule (60 mg total) by mouth daily after breakfast. 30 capsule 5   No current facility-administered medications for this visit.    Medication Side Effects: None  Allergies: No Known Allergies  Past Medical History:  Diagnosis Date   ADHD    Anal pain    Condyloma    Depression    Depression    GAD (generalized anxiety disorder)    Rectal mass    Wears glasses     Past Medical History, Surgical history, Social history, and Family history were reviewed and updated as appropriate.   Please see review of systems for further details on the patient's review from today.   Objective:   Physical Exam:  There were no vitals taken for this visit.  Physical Exam Constitutional:      General: He is not in acute distress. Musculoskeletal:        General: No deformity.  Neurological:     Mental Status: He is alert and oriented to person, place, and time.     Coordination: Coordination  normal.  Psychiatric:        Attention and Perception: Attention and perception normal. He does not perceive auditory or visual hallucinations.        Mood and Affect: Mood normal. Mood is not anxious or depressed. Affect is not labile, blunt, angry or inappropriate.        Speech: Speech normal.        Behavior: Behavior normal.        Thought Content: Thought content normal. Thought content is not paranoid or delusional. Thought content does not include homicidal or  suicidal ideation. Thought content does not include homicidal or suicidal plan.        Cognition and Memory: Cognition and memory normal.        Judgment: Judgment normal.     Comments: Insight intact     Lab Review:     Component Value Date/Time   NA 140 10/21/2019 0854   K 4.3 10/21/2019 0854   CL 102 10/21/2019 0854   CO2 31 10/21/2019 0854   GLUCOSE 86 10/21/2019 0854   BUN 10 10/21/2019 0854   CREATININE 0.76 10/21/2019 0854   CALCIUM 9.5 10/21/2019 0854   PROT 6.9 10/21/2019 0854   AST 22 10/21/2019 0854   ALT 16 10/21/2019 0854   BILITOT 0.6 10/21/2019 0854       Component Value Date/Time   WBC 5.9 10/21/2019 0854   RBC 4.88 10/21/2019 0854   HGB 15.7 10/21/2019 0854   HCT 46.1 10/21/2019 0854   PLT 258 10/21/2019 0854   MCV 94.5 10/21/2019 0854   MCH 32.2 10/21/2019 0854   MCHC 34.1 10/21/2019 0854   RDW 11.8 10/21/2019 0854   LYMPHSABS 2,018 10/21/2019 0854   EOSABS 18 10/21/2019 0854   BASOSABS 18 10/21/2019 0854    No results found for: "POCLITH", "LITHIUM"   No results found for: "PHENYTOIN", "PHENOBARB", "VALPROATE", "CBMZ"   .res Assessment: Plan:    Plan:  PDMP reviewed  1. Cymbalta 60mg  daily  2. Adderall 30mg  - take one and 1/2 tablets  Monitor BP between visits while taking stimulant medication.   RTC 3 months   Patient advised to contact office with any questions, adverse effects, or acute worsening in signs and symptoms.  Discussed potential benefits, risks, and side effects of stimulants with patient to include increased heart rate, palpitations, insomnia, increased anxiety, increased irritability, or decreased appetite.  Instructed patient to contact office if experiencing any significant tolerability issues.  Diagnoses and all orders for this visit:  Social anxiety disorder -     DULoxetine (CYMBALTA) 60 MG capsule; Take 1 capsule (60 mg total) by mouth daily after breakfast.  Attention deficit hyperactivity disorder (ADHD),  combined type -     amphetamine-dextroamphetamine (ADDERALL) 30 MG tablet; Take one tablet (30mg ) every morning and 1/2 tab total (15mg ) every day at 3pm. -     amphetamine-dextroamphetamine (ADDERALL) 30 MG tablet; Take one tab total 30 mg every morning and 1/2 tab total 15 mg every 3 PM. -     amphetamine-dextroamphetamine (ADDERALL) 30 MG tablet; Take one tablet (30mg ) every morning and 1/2 tab total (15mg ) every day at 3pm.     Please see After Visit Summary for patient specific instructions.  No future appointments.   No orders of the defined types were placed in this encounter.   -------------------------------

## 2023-03-11 ENCOUNTER — Other Ambulatory Visit: Payer: Self-pay

## 2023-03-11 ENCOUNTER — Telehealth: Payer: Self-pay | Admitting: Adult Health

## 2023-03-11 DIAGNOSIS — Z79899 Other long term (current) drug therapy: Secondary | ICD-10-CM | POA: Diagnosis not present

## 2023-03-11 DIAGNOSIS — F411 Generalized anxiety disorder: Secondary | ICD-10-CM | POA: Diagnosis not present

## 2023-03-11 DIAGNOSIS — F4010 Social phobia, unspecified: Secondary | ICD-10-CM

## 2023-03-11 DIAGNOSIS — F902 Attention-deficit hyperactivity disorder, combined type: Secondary | ICD-10-CM

## 2023-03-11 DIAGNOSIS — Z23 Encounter for immunization: Secondary | ICD-10-CM | POA: Diagnosis not present

## 2023-03-11 DIAGNOSIS — F909 Attention-deficit hyperactivity disorder, unspecified type: Secondary | ICD-10-CM | POA: Diagnosis not present

## 2023-03-11 DIAGNOSIS — A630 Anogenital (venereal) warts: Secondary | ICD-10-CM | POA: Diagnosis not present

## 2023-03-11 DIAGNOSIS — H52209 Unspecified astigmatism, unspecified eye: Secondary | ICD-10-CM | POA: Diagnosis not present

## 2023-03-11 MED ORDER — AMPHETAMINE-DEXTROAMPHETAMINE 30 MG PO TABS: ORAL_TABLET | ORAL | 0 refills | Status: DC

## 2023-03-11 MED ORDER — DULOXETINE HCL 60 MG PO CPEP: 60.0000 mg | ORAL_CAPSULE | Freq: Every day | ORAL | 5 refills | Status: DC

## 2023-03-11 NOTE — Telephone Encounter (Signed)
Lucas Herrera called at 4:05 reporting that his insurance changed in January and he can no longer use [facility].  [facility]all 30 [facility] [address]

## 2023-03-11 NOTE — Telephone Encounter (Signed)
Pended to new pharm. Canceled at [employer].

## 2023-03-12 DIAGNOSIS — Z79899 Other long term (current) drug therapy: Secondary | ICD-10-CM | POA: Diagnosis not present

## 2023-03-17 ENCOUNTER — Telehealth: Payer: Self-pay | Admitting: Adult Health

## 2023-03-18 ENCOUNTER — Other Ambulatory Visit: Payer: Self-pay

## 2023-03-18 DIAGNOSIS — F4010 Social phobia, unspecified: Secondary | ICD-10-CM

## 2023-03-18 DIAGNOSIS — F902 Attention-deficit hyperactivity disorder, combined type: Secondary | ICD-10-CM

## 2023-03-18 MED ORDER — DULOXETINE HCL 60 MG PO CPEP: 60.0000 mg | ORAL_CAPSULE | Freq: Every day | ORAL | 0 refills | Status: DC

## 2023-03-18 MED ORDER — AMPHETAMINE-DEXTROAMPHETAMINE 30 MG PO TABS: ORAL_TABLET | ORAL | 0 refills | Status: DC

## 2023-03-18 NOTE — Telephone Encounter (Signed)
[facility] [address] [facility] [address]

## 2023-03-18 NOTE — Telephone Encounter (Signed)
Addendum to attached message. Lucas Herrera called and said that his Adderall was sent to [facility] on [address]. Lucas Herrera said it was sent to the wrong pharmacy. The correct pharmacy to send it to [facility]:  [facility] [place] [place] [address] [place], [place] [address]

## 2023-03-19 ENCOUNTER — Telehealth: Payer: Self-pay | Admitting: Adult Health

## 2023-03-19 NOTE — Telephone Encounter (Signed)
 Pt called and said that his adderall 30 mg needs a PA. He was checking to see if we had received the denial

## 2023-03-19 NOTE — Telephone Encounter (Signed)
 Prior Authorization submitted and approved for Amphetamine -Dextroamphetamine  30 mg #45 for 30 day with [employer] effective through 03/18/2024

## 2023-03-20 ENCOUNTER — Other Ambulatory Visit: Payer: Self-pay

## 2023-03-20 DIAGNOSIS — F902 Attention-deficit hyperactivity disorder, combined type: Secondary | ICD-10-CM

## 2023-03-20 NOTE — Telephone Encounter (Signed)
 Pt notified and adderall pended to [address] per pt rqst

## 2023-03-20 NOTE — Telephone Encounter (Signed)
 Called pharmacy, they said that it went through for $10.72 Will have it ready around 78

## 2023-03-21 ENCOUNTER — Encounter: Payer: Self-pay | Admitting: Adult Health

## 2023-03-21 ENCOUNTER — Telehealth (INDEPENDENT_AMBULATORY_CARE_PROVIDER_SITE_OTHER): Payer: 59 | Admitting: Adult Health

## 2023-03-21 DIAGNOSIS — F4010 Social phobia, unspecified: Secondary | ICD-10-CM | POA: Diagnosis not present

## 2023-03-21 DIAGNOSIS — F902 Attention-deficit hyperactivity disorder, combined type: Secondary | ICD-10-CM | POA: Diagnosis not present

## 2023-03-21 MED ORDER — DULOXETINE HCL 60 MG PO CPEP: 60.0000 mg | ORAL_CAPSULE | Freq: Every day | ORAL | 1 refills | Status: DC

## 2023-03-21 MED ORDER — AMPHETAMINE-DEXTROAMPHETAMINE 30 MG PO TABS: ORAL_TABLET | ORAL | 0 refills | Status: DC

## 2023-03-21 NOTE — Progress Notes (Signed)
 Lucas Herrera [id number] 12/17/1985 38 y.o.  Virtual Visit via Video Note  I connected with pt @ on 03/21/23 at  9:30 AM EST by a video enabled telemedicine application and verified that I am speaking with the correct person using two identifiers.   I discussed the limitations of evaluation and management by telemedicine and the availability of in person appointments. The patient expressed understanding and agreed to proceed.  I discussed the assessment and treatment plan with the patient. The patient was provided an opportunity to ask questions and all were answered. The patient agreed with the plan and demonstrated an understanding of the instructions.   The patient was advised to call back or seek an in-person evaluation if the symptoms worsen or if the condition fails to improve as anticipated.  I provided 10 minutes of non-face-to-face time during this encounter.  The patient was located at home.  The provider was located at [facility].   Angeline LOISE Sayers, NP   Subjective:   Patient ID:  Lucas Herrera is a 38 y.o. (DOB [birth year]) male.  Chief Complaint: No chief complaint on file.   HPI Trase Bunda presents for follow-up of ADHD and SAD.  Describes mood today as ok. Pleasant. Tearful at times. Mood symptoms - denies depression, anxiety, and irritability. Reports stable interest and motivation. Denies anxiety/panic attacks. Denies worry, rumination, and over thinking. Mood is consistent. Stating I feel like I'm doing ok. Feels like medications work well. Taking medications as prescribed.  Energy levels stable. Active, started back at the gym.  Enjoys some usual interests and activities. Married. Lives with wife and 2 children. Spending time with family. Appetite adequate. Weight stable - 205 pounds - 75. Sleeps well most nights. Averages 6 to 7 hours. Focus and concentration stable. Completing tasks. Managing aspects of household.  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  Denies AH or VH. Denies self harm. Denies substance use.  Previous medication trials: Unknown   Review of Systems:  Review of Systems  Musculoskeletal:  Negative for gait problem.  Neurological:  Negative for tremors.  Psychiatric/Behavioral:         Please refer to HPI    Medications: I have reviewed the patient's current medications.  Current Outpatient Medications  Medication Sig Dispense Refill   amphetamine -dextroamphetamine  (ADDERALL) 30 MG tablet Take one tablet (30mg ) every morning and 1/2 tab total (15mg ) every day at 3pm. 45 tablet 0   amphetamine -dextroamphetamine  (ADDERALL) 30 MG tablet Take one tablet (30mg ) every morning and 1/2 tab total (15mg ) every day at 3pm. 45 tablet 0   amphetamine -dextroamphetamine  (ADDERALL) 30 MG tablet Take one tab total 30 mg every morning and 1/2 tab total 15 mg every 3 PM. 45 tablet 0   DULoxetine  (CYMBALTA ) 60 MG capsule Take 1 capsule (60 mg total) by mouth daily after breakfast. 30 capsule 0   No current facility-administered medications for this visit.    Medication Side Effects: None  Allergies: No Known Allergies  Past Medical History:  Diagnosis Date   ADHD    Anal pain    Condyloma    Depression    Depression    GAD (generalized anxiety disorder)    Rectal mass    Wears glasses     Family History  Problem Relation Age of Onset   CAD Mother    Depression Mother    Heart attack Mother    Heart disease Mother    Mental illness Mother  Suicidality Father    Depression Father    Heart attack Maternal Grandfather    Heart disease Maternal Grandfather     Social History   Socioeconomic History   Marital status: Married    Spouse name: Not on file   Number of children: Not on file   Years of education: Not on file   Highest education level: Not on file  Occupational History   Not on file  Tobacco Use   Smoking status: Never   Smokeless tobacco: Never  Vaping Use    Vaping status: Never Used  Substance and Sexual Activity   Alcohol use: Yes    Alcohol/week: 3.0 standard drinks of alcohol    Types: 3 Cans of beer per week    Comment: occasional 3 4 PER WEEK   Drug use: Yes    Types: Marijuana    Comment: 2-3 x a day USES DAILY AS OF 07-03-2020   Sexual activity: Not on file  Other Topics Concern   Not on file  Social History Narrative   Not on file   Social Drivers of Health   Financial Resource Strain: Not on file  Food Insecurity: Not on file  Transportation Needs: Not on file  Physical Activity: Not on file  Stress: Not on file  Social Connections: Not on file  Intimate Partner Violence: Not on file    Past Medical History, Surgical history, Social history, and Family history were reviewed and updated as appropriate.   Please see review of systems for further details on the patient's review from today.   Objective:   Physical Exam:  There were no vitals taken for this visit.  Physical Exam Constitutional:      General: He is not in acute distress. Musculoskeletal:        General: No deformity.  Neurological:     Mental Status: He is alert and oriented to person, place, and time.     Coordination: Coordination normal.  Psychiatric:        Attention and Perception: Attention and perception normal. He does not perceive auditory or visual hallucinations.        Mood and Affect: Mood is not anxious or depressed. Affect is not labile, blunt, angry or inappropriate.        Speech: Speech normal.        Behavior: Behavior normal.        Thought Content: Thought content normal. Thought content is not paranoid or delusional. Thought content does not include homicidal or suicidal ideation. Thought content does not include homicidal or suicidal plan.        Cognition and Memory: Cognition and memory normal.        Judgment: Judgment normal.     Comments: Insight intact     Lab Review:     Component Value Date/Time   NA 140  10/21/2019 0854   K 4.3 10/21/2019 0854   CL 102 10/21/2019 0854   CO2 31 10/21/2019 0854   GLUCOSE 86 10/21/2019 0854   BUN 10 10/21/2019 0854   CREATININE 0.76 10/21/2019 0854   CALCIUM 9.5 10/21/2019 0854   PROT 6.9 10/21/2019 0854   AST 22 10/21/2019 0854   ALT 16 10/21/2019 0854   BILITOT 0.6 10/21/2019 0854       Component Value Date/Time   WBC 5.9 10/21/2019 0854   RBC 4.88 10/21/2019 0854   HGB 15.7 10/21/2019 0854   HCT 46.1 10/21/2019 0854   PLT 258 10/21/2019 0854  MCV 94.5 10/21/2019 0854   MCH 32.2 10/21/2019 0854   MCHC 34.1 10/21/2019 0854   RDW 11.8 10/21/2019 0854   LYMPHSABS 2,018 10/21/2019 0854   EOSABS 18 10/21/2019 0854   BASOSABS 18 10/21/2019 0854    No results found for: POCLITH, LITHIUM   No results found for: PHENYTOIN, PHENOBARB, VALPROATE, CBMZ   .res Assessment: Plan:    Plan:  PDMP reviewed  1. Cymbalta  60mg  daily  2. Adderall 30mg  - take one and 1/2 tablets  Monitor BP between visits while taking stimulant medication. Recent BP 118/78  RTC 3 months    10 minutes spent dedicated to the care of this patient on the date of this encounter to include pre-visit review of records, ordering of medication, post visit documentation, and face-to-face time with the patient discussing ADD and SAD. Discussed continuing current medication regimen.  Patient advised to contact office with any questions, adverse effects, or acute worsening in signs and symptoms.  Discussed potential benefits, risks, and side effects of stimulants with patient to include increased heart rate, palpitations, insomnia, increased anxiety, increased irritability, or decreased appetite.  Instructed patient to contact office if experiencing any significant tolerability issues. There are no diagnoses linked to this encounter.   Please see After Visit Summary for patient specific instructions.  Future Appointments  Date Time Provider Department Center   03/21/2023  9:30 AM Aurie Harroun Nattalie, NP [facility] None    No orders of the defined types were placed in this encounter.     -------------------------------

## 2023-04-23 DIAGNOSIS — A630 Anogenital (venereal) warts: Secondary | ICD-10-CM | POA: Diagnosis not present

## 2023-08-20 DIAGNOSIS — Z0000 Encounter for general adult medical examination without abnormal findings: Secondary | ICD-10-CM | POA: Diagnosis not present

## 2023-09-19 ENCOUNTER — Telehealth: Payer: Self-pay | Admitting: Adult Health

## 2023-09-19 ENCOUNTER — Other Ambulatory Visit: Payer: Self-pay

## 2023-09-19 DIAGNOSIS — F902 Attention-deficit hyperactivity disorder, combined type: Secondary | ICD-10-CM

## 2023-09-19 MED ORDER — AMPHETAMINE-DEXTROAMPHETAMINE 30 MG PO TABS: ORAL_TABLET | ORAL | 0 refills | Status: DC

## 2023-09-19 NOTE — Telephone Encounter (Signed)
 Pt called at 2:30p requesting refill of Adderall to   [id number] [place], [place] - [address] AT [facility] [address] [place] [address] Phone: [phone]  Fax: [phone]   Next appt 8/12

## 2023-09-19 NOTE — Telephone Encounter (Signed)
 Past due for FU. Pended enough to appt.

## 2023-09-23 ENCOUNTER — Telehealth: Admitting: Adult Health

## 2023-09-23 ENCOUNTER — Encounter: Payer: Self-pay | Admitting: Adult Health

## 2023-09-23 DIAGNOSIS — F4010 Social phobia, unspecified: Secondary | ICD-10-CM | POA: Diagnosis not present

## 2023-09-23 DIAGNOSIS — F909 Attention-deficit hyperactivity disorder, unspecified type: Secondary | ICD-10-CM | POA: Diagnosis not present

## 2023-09-23 DIAGNOSIS — F902 Attention-deficit hyperactivity disorder, combined type: Secondary | ICD-10-CM

## 2023-09-23 MED ORDER — AMPHETAMINE-DEXTROAMPHETAMINE 30 MG PO TABS: ORAL_TABLET | ORAL | 0 refills | Status: DC

## 2023-09-23 MED ORDER — DULOXETINE HCL 60 MG PO CPEP: 60.0000 mg | ORAL_CAPSULE | Freq: Every day | ORAL | 1 refills | Status: DC

## 2023-09-23 NOTE — Progress Notes (Signed)
 Lucas Herrera [id number] 1985/12/16 38 y.o.  Virtual Visit via Video Note  I connected with pt @ on 09/23/23 at  1:00 PM EDT by a video enabled telemedicine application and verified that I am speaking with the correct person using two identifiers.   I discussed the limitations of evaluation and management by telemedicine and the availability of in person appointments. The patient expressed understanding and agreed to proceed.  I discussed the assessment and treatment plan with the patient. The patient was provided an opportunity to ask questions and all were answered. The patient agreed with the plan and demonstrated an understanding of the instructions.   The patient was advised to call back or seek an in-person evaluation if the symptoms worsen or if the condition fails to improve as anticipated.  I provided 10 minutes of non-face-to-face time during this encounter.  The patient was located at home.  The provider was located at [facility].   Angeline LOISE Sayers, NP   Subjective:   Patient ID:  Lucas Herrera is a 38 y.o. (DOB May 21, 1985) male.  Chief Complaint: No chief complaint on file.   HPI Lucas Herrera presents for follow-up of ADHD and SAD.  Describes mood today as ok. Pleasant. Tearful at times. Mood symptoms - denies depression, anxiety and irritability. Reports stable interest and motivation. Denies anxiety/panic attacks. Denies worry, rumination, and over thinking. Reports mood is stable. Stating I feel like I'm doing ok. Feels like medications work well. Taking medications as prescribed.  Energy levels stable. Active, does not have a regular exercise routine, but is active. Enjoys some usual interests and activities. Married. Lives with wife and 2 children. Spending time with family. Appetite adequate. Weight gain - 205 to 210 pounds - 75. Sleeps well most nights. Averages 6 to 7 hours. Focus and concentration stable. Completing  tasks. 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  Denies AH or VH. Denies self harm. Denies substance use.  Previous medication trials: Unknown  Review of Systems:  Review of Systems  Musculoskeletal:  Negative for gait problem.  Neurological:  Negative for tremors.  Psychiatric/Behavioral:         Please refer to HPI   Medications: I have reviewed the patient's current medications.  Current Outpatient Medications  Medication Sig Dispense Refill   amphetamine -dextroamphetamine  (ADDERALL) 30 MG tablet Take one tablet (30mg ) every morning and 1/2 tab total (15mg ) every day at 3pm. 45 tablet 0   amphetamine -dextroamphetamine  (ADDERALL) 30 MG tablet Take one tab total 30 mg every morning and 1/2 tab total 15 mg every 3 PM. 45 tablet 0   amphetamine -dextroamphetamine  (ADDERALL) 30 MG tablet Take one tablet (30mg ) every morning and 1/2 tab total (15mg ) every day at 3pm. 6 tablet 0   DULoxetine  (CYMBALTA ) 60 MG capsule Take 1 capsule (60 mg total) by mouth daily after breakfast. 90 capsule 1   No current facility-administered medications for this visit.    Medication Side Effects: None  Allergies: No Known Allergies  Past Medical History:  Diagnosis Date   ADHD    Anal pain    Condyloma    Depression    Depression    GAD (generalized anxiety disorder)    Rectal mass    Wears glasses     Family History  Problem Relation Age of Onset   CAD Mother    Depression Mother    Heart attack Mother    Heart disease Mother    Mental  illness Mother    Suicidality Father    Depression Father    Heart attack Maternal Grandfather    Heart disease Maternal Grandfather     Social History   Socioeconomic History   Marital status: Married    Spouse name: Not on file   Number of children: Not on file   Years of education: Not on file   Highest education level: Not on file  Occupational History   Not on file  Tobacco Use   Smoking status: Never    Smokeless tobacco: Never  Vaping Use   Vaping status: Never Used  Substance and Sexual Activity   Alcohol use: Yes    Alcohol/week: 3.0 standard drinks of alcohol    Types: 3 Cans of beer per week    Comment: occasional 3 4 PER WEEK   Drug use: Yes    Types: Marijuana    Comment: 2-3 x a day USES DAILY AS OF 07-03-2020   Sexual activity: Not on file  Other Topics Concern   Not on file  Social History Narrative   Not on file   Social Drivers of Health   Financial Resource Strain: Not on file  Food Insecurity: Not on file  Transportation Needs: Not on file  Physical Activity: Not on file  Stress: Not on file  Social Connections: Not on file  Intimate Partner Violence: Not on file    Past Medical History, Surgical history, Social history, and Family history were reviewed and updated as appropriate.   Please see review of systems for further details on the patient's review from today.   Objective:   Physical Exam:  There were no vitals taken for this visit.  Physical Exam Constitutional:      General: He is not in acute distress. Musculoskeletal:        General: No deformity.  Neurological:     Mental Status: He is alert and oriented to person, place, and time.     Coordination: Coordination normal.  Psychiatric:        Attention and Perception: Attention and perception normal. He does not perceive auditory or visual hallucinations.        Mood and Affect: Mood normal. Mood is not anxious or depressed. Affect is not labile, blunt, angry or inappropriate.        Speech: Speech normal.        Behavior: Behavior normal.        Thought Content: Thought content normal. Thought content is not paranoid or delusional. Thought content does not include homicidal or suicidal ideation. Thought content does not include homicidal or suicidal plan.        Cognition and Memory: Cognition and memory normal.        Judgment: Judgment normal.     Comments: Insight intact     Lab  Review:     Component Value Date/Time   NA 140 10/21/2019 0854   K 4.3 10/21/2019 0854   CL 102 10/21/2019 0854   CO2 31 10/21/2019 0854   GLUCOSE 86 10/21/2019 0854   BUN 10 10/21/2019 0854   CREATININE 0.76 10/21/2019 0854   CALCIUM 9.5 10/21/2019 0854   PROT 6.9 10/21/2019 0854   AST 22 10/21/2019 0854   ALT 16 10/21/2019 0854   BILITOT 0.6 10/21/2019 0854       Component Value Date/Time   WBC 5.9 10/21/2019 0854   RBC 4.88 10/21/2019 0854   HGB 15.7 10/21/2019 0854   HCT 46.1 10/21/2019 0854  PLT 258 10/21/2019 0854   MCV 94.5 10/21/2019 0854   MCH 32.2 10/21/2019 0854   MCHC 34.1 10/21/2019 0854   RDW 11.8 10/21/2019 0854   LYMPHSABS 2,018 10/21/2019 0854   EOSABS 18 10/21/2019 0854   BASOSABS 18 10/21/2019 0854    No results found for: POCLITH, LITHIUM   No results found for: PHENYTOIN, PHENOBARB, VALPROATE, CBMZ   .res Assessment: Plan:    Plan:  PDMP reviewed  1. Cymbalta  60mg  daily  2. Adderall 30mg  - take one and 1/2 tablets  Monitor BP between visits while taking stimulant medication. Recent BP 118/78  RTC 6 months - will call in 3 months for next set of refills.   10 minutes spent dedicated to the care of this patient on the date of this encounter to include pre-visit review of records, ordering of medication, post visit documentation, and face-to-face time with the patient discussing ADD and SAD. Discussed continuing current medication regimen.  Patient advised to contact office with any questions, adverse effects, or acute worsening in signs and symptoms.  Discussed potential benefits, risks, and side effects of stimulants with patient to include increased heart rate, palpitations, insomnia, increased anxiety, increased irritability, or decreased appetite.  Instructed patient to contact office if experiencing any significant tolerability issues.  There are no diagnoses linked to this encounter.   Please see After Visit Summary for  patient specific instructions.  Future Appointments  Date Time Provider Department Center  09/23/2023  1:00 PM Jasten Guyette Nattalie, NP [facility] None    No orders of the defined types were placed in this encounter.     -------------------------------

## 2023-09-23 NOTE — Progress Notes (Signed)
 Lucas Herrera [id number] January 27, 1986 38 y.o.  Virtual Visit via Video Note  I connected with pt @ on 09/23/23 at  1:00 PM EDT by a video enabled telemedicine application and verified that I am speaking with the correct person using two identifiers.   I discussed the limitations of evaluation and management by telemedicine and the availability of in person appointments. The patient expressed understanding and agreed to proceed.  I discussed the assessment and treatment plan with the patient. The patient was provided an opportunity to ask questions and all were answered. The patient agreed with the plan and demonstrated an understanding of the instructions.   The patient was advised to call back or seek an in-person evaluation if the symptoms worsen or if the condition fails to improve as anticipated.  I provided 10 minutes of non-face-to-face time during this encounter.  The patient was located at home.  The provider was located at [facility].   Angeline LOISE Sayers, NP   Subjective:   Patient ID:  Lucas Herrera is a 38 y.o. (DOB 1985/08/06) male.  Chief Complaint: No chief complaint on file.   HPI Lucas Herrera presents for follow-up of ADHD and SAD.  Describes mood today as ok. Pleasant. Tearful at times. Mood symptoms - denies depression, anxiety and irritability. Reports stable interest and motivation. Denies anxiety/panic attacks. Denies worry, rumination, and over thinking. Reports mood is stable. Stating I feel like I'm doing ok. Feels like medications work well. Taking medications as prescribed.  Energy levels stable. Active, does not have a regular exercise routine, but is active. Enjoys some usual interests and activities. Married. Lives with wife and 2 children. Spending time with family. Appetite adequate. Weight gain - 205 to 210 pounds - 75. Sleeps well most nights. Averages 6 to 7 hours. Focus and concentration stable. Completing  tasks. 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  Denies AH or VH. Denies self harm. Denies substance use.  Previous medication trials: Unknown  Review of Systems:  Review of Systems  Musculoskeletal:  Negative for gait problem.  Neurological:  Negative for tremors.  Psychiatric/Behavioral:         Please refer to HPI   Medications: I have reviewed the patient's current medications.  Current Outpatient Medications  Medication Sig Dispense Refill   amphetamine-dextroamphetamine (ADDERALL) 30 MG tablet Take one tablet (30mg ) every morning and 1/2 tab total (15mg ) every day at 3pm. 45 tablet 0   amphetamine-dextroamphetamine (ADDERALL) 30 MG tablet Take one tab total 30 mg every morning and 1/2 tab total 15 mg every 3 PM. 45 tablet 0   amphetamine-dextroamphetamine (ADDERALL) 30 MG tablet Take one tablet (30mg ) every morning and 1/2 tab total (15mg ) every day at 3pm. 6 tablet 0   DULoxetine (CYMBALTA) 60 MG capsule Take 1 capsule (60 mg total) by mouth daily after breakfast. 90 capsule 1   No current facility-administered medications for this visit.    Medication Side Effects: None  Allergies: No Known Allergies  Past Medical History:  Diagnosis Date   ADHD    Anal pain    Condyloma    Depression    Depression    GAD (generalized anxiety disorder)    Rectal mass    Wears glasses     Family History  Problem Relation Age of Onset   CAD Mother    Depression Mother    Heart attack Mother    Heart disease Mother    Mental  illness Mother    Suicidality Father    Depression Father    Heart attack Maternal Grandfather    Heart disease Maternal Grandfather     Social History   Socioeconomic History   Marital status: Married    Spouse name: Not on file   Number of children: Not on file   Years of education: Not on file   Highest education level: Not on file  Occupational History   Not on file  Tobacco Use   Smoking status: Never    Smokeless tobacco: Never  Vaping Use   Vaping status: Never Used  Substance and Sexual Activity   Alcohol use: Yes    Alcohol/week: 3.0 standard drinks of alcohol    Types: 3 Cans of beer per week    Comment: occasional 3 4 PER WEEK   Drug use: Yes    Types: Marijuana    Comment: 2-3 x a day USES DAILY AS OF 07-03-2020   Sexual activity: Not on file  Other Topics Concern   Not on file  Social History Narrative   Not on file   Social Drivers of Health   Financial Resource Strain: Not on file  Food Insecurity: Not on file  Transportation Needs: Not on file  Physical Activity: Not on file  Stress: Not on file  Social Connections: Not on file  Intimate Partner Violence: Not on file    Past Medical History, Surgical history, Social history, and Family history were reviewed and updated as appropriate.   Please see review of systems for further details on the patient's review from today.   Objective:   Physical Exam:  There were no vitals taken for this visit.  Physical Exam Constitutional:      General: He is not in acute distress. Musculoskeletal:        General: No deformity.  Neurological:     Mental Status: He is alert and oriented to person, place, and time.     Coordination: Coordination normal.  Psychiatric:        Attention and Perception: Attention and perception normal. He does not perceive auditory or visual hallucinations.        Mood and Affect: Mood normal. Mood is not anxious or depressed. Affect is not labile, blunt, angry or inappropriate.        Speech: Speech normal.        Behavior: Behavior normal.        Thought Content: Thought content normal. Thought content is not paranoid or delusional. Thought content does not include homicidal or suicidal ideation. Thought content does not include homicidal or suicidal plan.        Cognition and Memory: Cognition and memory normal.        Judgment: Judgment normal.     Comments: Insight intact     Lab  Review:     Component Value Date/Time   NA 140 10/21/2019 0854   K 4.3 10/21/2019 0854   CL 102 10/21/2019 0854   CO2 31 10/21/2019 0854   GLUCOSE 86 10/21/2019 0854   BUN 10 10/21/2019 0854   CREATININE 0.76 10/21/2019 0854   CALCIUM 9.5 10/21/2019 0854   PROT 6.9 10/21/2019 0854   AST 22 10/21/2019 0854   ALT 16 10/21/2019 0854   BILITOT 0.6 10/21/2019 0854       Component Value Date/Time   WBC 5.9 10/21/2019 0854   RBC 4.88 10/21/2019 0854   HGB 15.7 10/21/2019 0854   HCT 46.1 10/21/2019 0854  PLT 258 10/21/2019 0854   MCV 94.5 10/21/2019 0854   MCH 32.2 10/21/2019 0854   MCHC 34.1 10/21/2019 0854   RDW 11.8 10/21/2019 0854   LYMPHSABS 2,018 10/21/2019 0854   EOSABS 18 10/21/2019 0854   BASOSABS 18 10/21/2019 0854    No results found for: POCLITH, LITHIUM   No results found for: PHENYTOIN, PHENOBARB, VALPROATE, CBMZ   .res Assessment: Plan:    Plan:  PDMP reviewed  1. Cymbalta 60mg  daily  2. Adderall 30mg  - take one and 1/2 tablets  Monitor BP between visits while taking stimulant medication. Recent BP 118/78  RTC 6 months - will call in 3 months for next set of refills.   10 minutes spent dedicated to the care of this patient on the date of this encounter to include pre-visit review of records, ordering of medication, post visit documentation, and face-to-face time with the patient discussing ADD and SAD. Discussed continuing current medication regimen.  Patient advised to contact office with any questions, adverse effects, or acute worsening in signs and symptoms.  Discussed potential benefits, risks, and side effects of stimulants with patient to include increased heart rate, palpitations, insomnia, increased anxiety, increased irritability, or decreased appetite.  Instructed patient to contact office if experiencing any significant tolerability issues.  There are no diagnoses linked to this encounter.   Please see After Visit Summary for  patient specific instructions.  Future Appointments  Date Time Provider Department Center  09/23/2023  1:00 PM Pakou Rainbow Nattalie, NP [facility] None    No orders of the defined types were placed in this encounter.     -------------------------------

## 2023-12-24 ENCOUNTER — Telehealth (INDEPENDENT_AMBULATORY_CARE_PROVIDER_SITE_OTHER): Admitting: Adult Health

## 2023-12-24 ENCOUNTER — Encounter: Payer: Self-pay | Admitting: Adult Health

## 2023-12-24 DIAGNOSIS — F4010 Social phobia, unspecified: Secondary | ICD-10-CM

## 2023-12-24 DIAGNOSIS — F902 Attention-deficit hyperactivity disorder, combined type: Secondary | ICD-10-CM | POA: Diagnosis not present

## 2023-12-24 MED ORDER — AMPHETAMINE-DEXTROAMPHETAMINE 30 MG PO TABS: ORAL_TABLET | ORAL | 0 refills | Status: AC

## 2023-12-24 MED ORDER — DULOXETINE HCL 60 MG PO CPEP: 60.0000 mg | ORAL_CAPSULE | Freq: Every day | ORAL | 3 refills | Status: AC

## 2023-12-24 NOTE — Progress Notes (Signed)
 Lucas Herrera [id number] October 06, 1985 38 y.o.  Virtual Visit via Video Note  I connected with pt @ on 12/24/23 at 10:00 AM EST by a video enabled telemedicine application and verified that I am speaking with the correct person using two identifiers.   I discussed the limitations of evaluation and management by telemedicine and the availability of in person appointments. The patient expressed understanding and agreed to proceed.  I discussed the assessment and treatment plan with the patient. The patient was provided an opportunity to ask questions and all were answered. The patient agreed with the plan and demonstrated an understanding of the instructions.   The patient was advised to call back or seek an in-person evaluation if the symptoms worsen or if the condition fails to improve as anticipated.  I provided 10 minutes of non-face-to-face time during this encounter.  The patient was located at home.  The provider was located at [facility].   Lucas LOISE Sayers, NP   Subjective:   Patient ID:  Lucas Herrera is a 38 y.o. (DOB Feb 28, 1985) male.  Chief Complaint: No chief complaint on file.   HPI Lucas Herrera presents for follow-up of ADHD and SAD.  Describes mood today as ok. Pleasant. Denies tearfulness. Mood symptoms - denies depression, anxiety and irritability. Reports increased situational stressors. Reports stable interest and motivation. Denies anxiety/panic attacks. Denies worry, rumination and over thinking. Reports mood is stable. Stating I feel like I'm doing alright - keeping busy. Feels like medications work well. Taking medications as prescribed.  Energy levels stable. Active, does not have a regular exercise routine, but is active. Enjoys some usual interests and activities. Married. Lives with wife and 2 children. Spending time with family. Appetite adequate. Weight stable - 210 pounds - 75. Sleeps well most nights. Averages 6 to  7 hours. Focus and concentration stable. Completing tasks. 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  Denies AH or VH. Denies self harm. Denies substance use.  Previous medication trials: Unknown   Review of Systems:  Review of Systems  Musculoskeletal:  Negative for gait problem.  Neurological:  Negative for tremors.  Psychiatric/Behavioral:         Please refer to HPI    Medications: I have reviewed the patient's current medications.  Current Outpatient Medications  Medication Sig Dispense Refill   amphetamine -dextroamphetamine  (ADDERALL) 30 MG tablet Take one tablet (30mg ) every morning and 1/2 tab total (15mg ) every day at 3pm. 45 tablet 0   amphetamine -dextroamphetamine  (ADDERALL) 30 MG tablet Take one tab total 30 mg every morning and 1/2 tab total 15 mg every 3 PM. 45 tablet 0   amphetamine -dextroamphetamine  (ADDERALL) 30 MG tablet Take one tablet (30mg ) every morning and 1/2 tab total (15mg ) every day at 3pm. 45 tablet 0   DULoxetine  (CYMBALTA ) 60 MG capsule Take 1 capsule (60 mg total) by mouth daily after breakfast. 90 capsule 1   No current facility-administered medications for this visit.    Medication Side Effects: None  Allergies: No Known Allergies  Past Medical History:  Diagnosis Date   ADHD    Anal pain    Condyloma    Depression    Depression    GAD (generalized anxiety disorder)    Rectal mass    Wears glasses     Family History  Problem Relation Age of Onset   CAD Mother    Depression Mother    Heart attack Mother    Heart disease  Mother    Mental illness Mother    Suicidality Father    Depression Father    Heart attack Maternal Grandfather    Heart disease Maternal Grandfather     Social History   Socioeconomic History   Marital status: Married    Spouse name: Not on file   Number of children: Not on file   Years of education: Not on file   Highest education level: Not on file  Occupational History    Not on file  Tobacco Use   Smoking status: Never   Smokeless tobacco: Never  Vaping Use   Vaping status: Never Used  Substance and Sexual Activity   Alcohol use: Yes    Alcohol/week: 3.0 standard drinks of alcohol    Types: 3 Cans of beer per week    Comment: occasional 3 4 PER WEEK   Drug use: Yes    Types: Marijuana    Comment: 2-3 x a day USES DAILY AS OF 07-03-2020   Sexual activity: Not on file  Other Topics Concern   Not on file  Social History Narrative   Not on file   Social Drivers of Health   Financial Resource Strain: Not on file  Food Insecurity: Not on file  Transportation Needs: Not on file  Physical Activity: Not on file  Stress: Not on file  Social Connections: Not on file  Intimate Partner Violence: Not on file    Past Medical History, Surgical history, Social history, and Family history were reviewed and updated as appropriate.   Please see review of systems for further details on the patient's review from today.   Objective:   Physical Exam:  There were no vitals taken for this visit.  Physical Exam Constitutional:      General: He is not in acute distress. Musculoskeletal:        General: No deformity.  Neurological:     Mental Status: He is alert and oriented to person, place, and time.     Coordination: Coordination normal.  Psychiatric:        Attention and Perception: Attention and perception normal. He does not perceive auditory or visual hallucinations.        Mood and Affect: Mood normal. Mood is not anxious or depressed. Affect is not labile, blunt, angry or inappropriate.        Speech: Speech normal.        Behavior: Behavior normal.        Thought Content: Thought content normal. Thought content is not paranoid or delusional. Thought content does not include homicidal or suicidal ideation. Thought content does not include homicidal or suicidal plan.        Cognition and Memory: Cognition and memory normal.        Judgment: Judgment  normal.     Comments: Insight intact     Lab Review:     Component Value Date/Time   NA 140 10/21/2019 0854   K 4.3 10/21/2019 0854   CL 102 10/21/2019 0854   CO2 31 10/21/2019 0854   GLUCOSE 86 10/21/2019 0854   BUN 10 10/21/2019 0854   CREATININE 0.76 10/21/2019 0854   CALCIUM 9.5 10/21/2019 0854   PROT 6.9 10/21/2019 0854   AST 22 10/21/2019 0854   ALT 16 10/21/2019 0854   BILITOT 0.6 10/21/2019 0854       Component Value Date/Time   WBC 5.9 10/21/2019 0854   RBC 4.88 10/21/2019 0854   HGB 15.7 10/21/2019 0854  HCT 46.1 10/21/2019 0854   PLT 258 10/21/2019 0854   MCV 94.5 10/21/2019 0854   MCH 32.2 10/21/2019 0854   MCHC 34.1 10/21/2019 0854   RDW 11.8 10/21/2019 0854   LYMPHSABS 2,018 10/21/2019 0854   EOSABS 18 10/21/2019 0854   BASOSABS 18 10/21/2019 0854    No results found for: POCLITH, LITHIUM   No results found for: PHENYTOIN, PHENOBARB, VALPROATE, CBMZ   .res Assessment: Plan:    Plan:  PDMP reviewed  1. Cymbalta  60mg  daily  2. Adderall 30mg  - take one and 1/2 tablets daily  Monitor BP between visits while taking stimulant medication.   RTC 6 months - will call in 3 months for next set of scripts.  10 minutes spent dedicated to the care of this patient on the date of this encounter to include pre-visit review of records, ordering of medication, post visit documentation, and face-to-face time with the patient discussing ADHD and social anxiety disorder. Discussed continuing current medication regimen.  Patient advised to contact office with any questions, adverse effects, or acute worsening in signs and symptoms.  Discussed potential benefits, risks, and side effects of stimulants with patient to include increased heart rate, palpitations, insomnia, increased anxiety, increased irritability, or decreased appetite.  Instructed patient to contact office if experiencing any significant tolerability issues.  There are no diagnoses  linked to this encounter.   Please see After Visit Summary for patient specific instructions.  Future Appointments  Date Time Provider Department Center  12/24/2023 10:00 AM Hana Trippett Nattalie, NP CP-[facility] None    No orders of the defined types were placed in this encounter.     -------------------------------

## 2024-06-22 ENCOUNTER — Telehealth: Admitting: Adult Health
